# Patient Record
Sex: Female | Born: 1970
Health system: Southern US, Community
[De-identification: ages and names within clinical notes are randomized; demographics above are authoritative.]

## PROBLEM LIST (undated history)

## (undated) DIAGNOSIS — T7840XA Allergy, unspecified, initial encounter: Secondary | ICD-10-CM

## (undated) DIAGNOSIS — Z808 Family history of malignant neoplasm of other organs or systems: Secondary | ICD-10-CM

## (undated) DIAGNOSIS — J45909 Unspecified asthma, uncomplicated: Secondary | ICD-10-CM

## (undated) DIAGNOSIS — G43909 Migraine, unspecified, not intractable, without status migrainosus: Secondary | ICD-10-CM

## (undated) DIAGNOSIS — U071 COVID-19: Secondary | ICD-10-CM

## (undated) DIAGNOSIS — K219 Gastro-esophageal reflux disease without esophagitis: Secondary | ICD-10-CM

## (undated) HISTORY — DX: COVID-19: U07.1

## (undated) HISTORY — DX: Gastro-esophageal reflux disease without esophagitis: K21.9

## (undated) HISTORY — DX: Migraine, unspecified, not intractable, without status migrainosus: G43.909

## (undated) HISTORY — PX: TUBAL LIGATION: SHX77

## (undated) HISTORY — DX: Unspecified asthma, uncomplicated: J45.909

## (undated) HISTORY — DX: Allergy, unspecified, initial encounter: T78.40XA

## (undated) HISTORY — DX: Family history of malignant neoplasm of other organs or systems: Z80.8

---
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
## 1998-10-24 ENCOUNTER — Ambulatory Visit (HOSPITAL_COMMUNITY): Admission: RE | Admit: 1998-10-24 | Discharge: 1998-10-24 | Payer: Self-pay | Admitting: Obstetrics and Gynecology

## 1999-01-14 ENCOUNTER — Inpatient Hospital Stay (HOSPITAL_COMMUNITY): Admission: AD | Admit: 1999-01-14 | Discharge: 1999-01-16 | Payer: Self-pay | Admitting: Obstetrics and Gynecology

## 1999-01-19 ENCOUNTER — Encounter (HOSPITAL_COMMUNITY): Admission: RE | Admit: 1999-01-19 | Discharge: 1999-04-19 | Payer: Self-pay | Admitting: Obstetrics and Gynecology

## 1999-02-21 ENCOUNTER — Other Ambulatory Visit: Admission: RE | Admit: 1999-02-21 | Discharge: 1999-02-21 | Payer: Self-pay | Admitting: Obstetrics and Gynecology

## 2000-03-29 ENCOUNTER — Other Ambulatory Visit: Admission: RE | Admit: 2000-03-29 | Discharge: 2000-03-29 | Payer: Self-pay | Admitting: Obstetrics and Gynecology

## 2001-02-25 ENCOUNTER — Ambulatory Visit (HOSPITAL_COMMUNITY): Admission: RE | Admit: 2001-02-25 | Discharge: 2001-02-25 | Payer: Self-pay | Admitting: Obstetrics and Gynecology

## 2001-05-19 ENCOUNTER — Inpatient Hospital Stay (HOSPITAL_COMMUNITY): Admission: AD | Admit: 2001-05-19 | Discharge: 2001-05-19 | Payer: Self-pay | Admitting: Obstetrics and Gynecology

## 2001-05-24 ENCOUNTER — Inpatient Hospital Stay (HOSPITAL_COMMUNITY): Admission: AD | Admit: 2001-05-24 | Discharge: 2001-05-27 | Payer: Self-pay | Admitting: Obstetrics & Gynecology

## 2001-06-26 ENCOUNTER — Other Ambulatory Visit: Admission: RE | Admit: 2001-06-26 | Discharge: 2001-06-26 | Payer: Self-pay | Admitting: Obstetrics and Gynecology

## 2002-09-18 ENCOUNTER — Other Ambulatory Visit: Admission: RE | Admit: 2002-09-18 | Discharge: 2002-09-18 | Payer: Self-pay | Admitting: Obstetrics and Gynecology

## 2003-11-05 ENCOUNTER — Other Ambulatory Visit: Admission: RE | Admit: 2003-11-05 | Discharge: 2003-11-05 | Payer: Self-pay | Admitting: Obstetrics and Gynecology

## 2004-11-17 ENCOUNTER — Other Ambulatory Visit: Admission: RE | Admit: 2004-11-17 | Discharge: 2004-11-17 | Payer: Self-pay | Admitting: Obstetrics and Gynecology

## 2005-11-01 ENCOUNTER — Other Ambulatory Visit: Admission: RE | Admit: 2005-11-01 | Discharge: 2005-11-01 | Payer: Self-pay | Admitting: Obstetrics and Gynecology

## 2006-02-26 ENCOUNTER — Ambulatory Visit (HOSPITAL_COMMUNITY): Admission: RE | Admit: 2006-02-26 | Discharge: 2006-02-26 | Payer: Self-pay | Admitting: Obstetrics and Gynecology

## 2006-05-28 ENCOUNTER — Inpatient Hospital Stay (HOSPITAL_COMMUNITY): Admission: AD | Admit: 2006-05-28 | Discharge: 2006-06-01 | Payer: Self-pay | Admitting: Obstetrics and Gynecology

## 2006-05-29 ENCOUNTER — Encounter (INDEPENDENT_AMBULATORY_CARE_PROVIDER_SITE_OTHER): Payer: Self-pay | Admitting: Specialist

## 2008-10-22 ENCOUNTER — Encounter: Admission: RE | Admit: 2008-10-22 | Discharge: 2008-10-22 | Payer: Self-pay | Admitting: Obstetrics and Gynecology

## 2009-06-17 ENCOUNTER — Ambulatory Visit: Payer: Self-pay | Admitting: Urology

## 2010-01-24 IMAGING — MG MM DIAGNOSTIC UNILATERAL R
1 series · 1 of 1 positions shown · non-contrast
Comparison: 10/15/2008

CLINICAL DATA: Abnormal screening mammogram with possible right
breast mass.

[REDACTED] RIGHT MAMMOGRAM

[R CC]
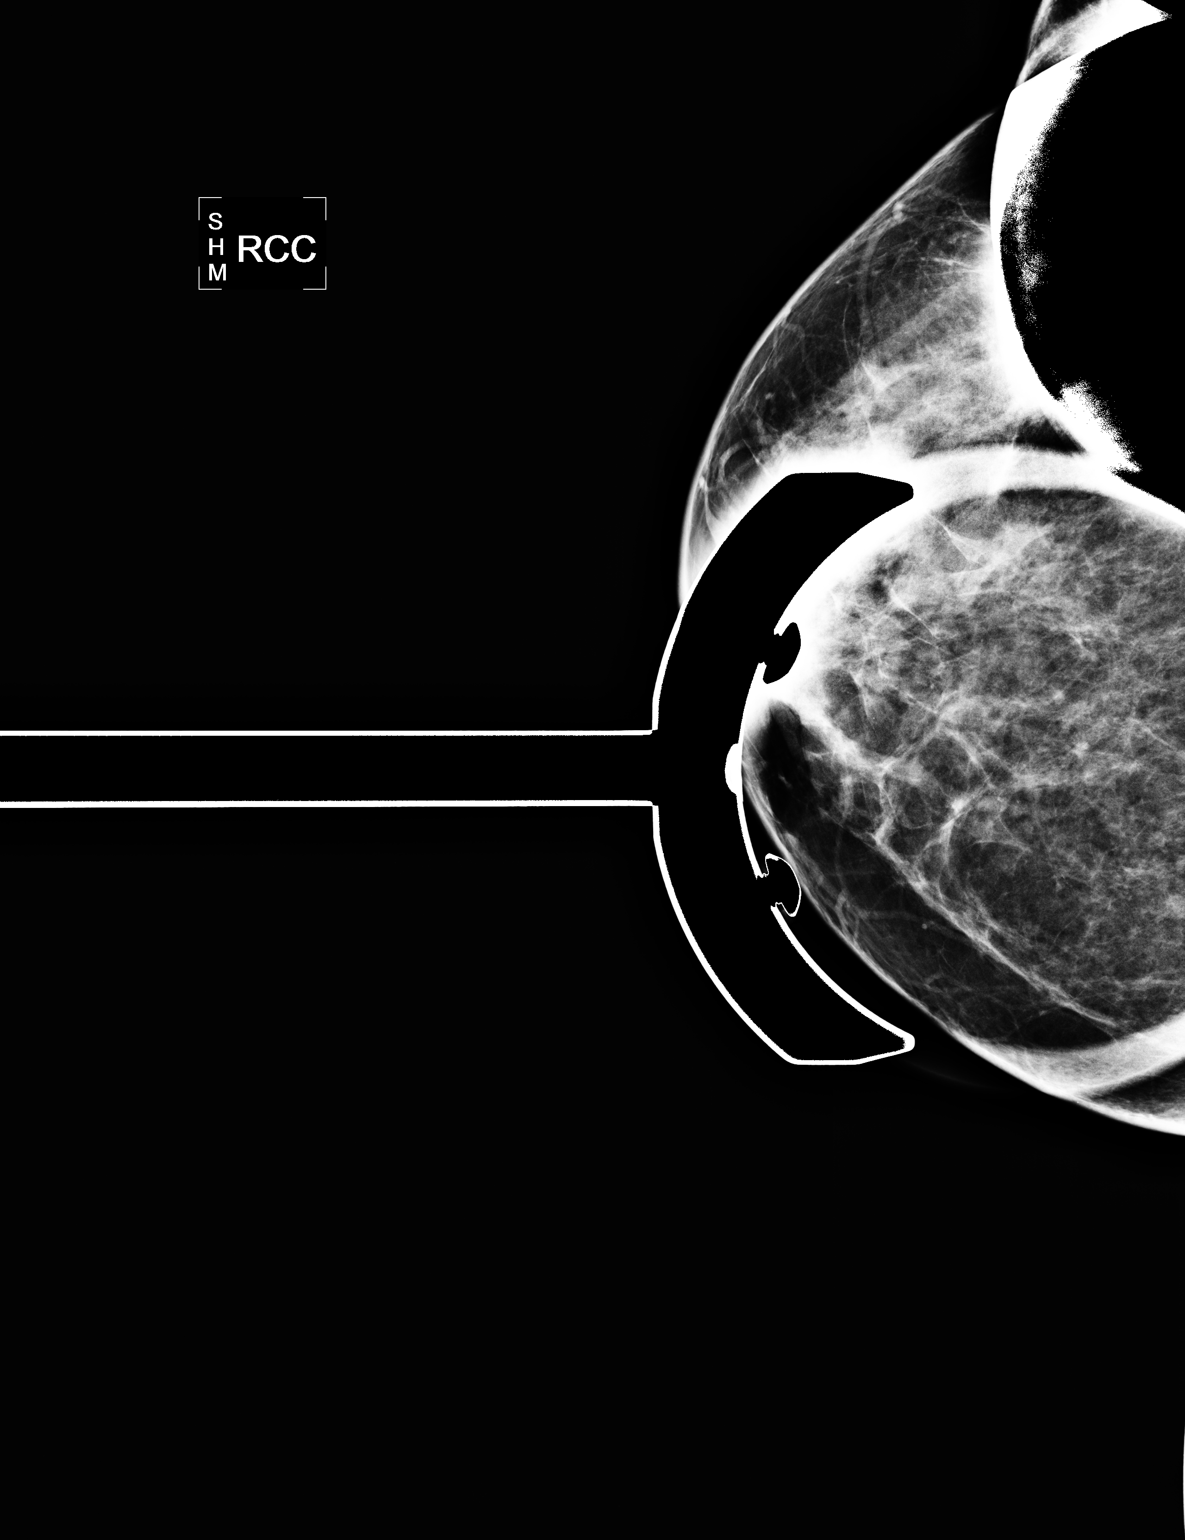

[1 of 1 positions shown; findings below may reference images not displayed]

FINDINGS: CC spot compression and ML views of the right breast are
performed in the area of screening mammogram abnormality. There is
no evidence of mass, distortion, or suspicious calcifications.
IMPRESSION: No specific mammographic evidence of malignancy or abnormality,
specifically in the area of screening mammogram abnormality.

These findings were discussed with the patient.  She was encouraged
to continue monthly self exams and to contact the [REDACTED] or
primary physician if any changes noted.

.

BI-RADS CATEGORY 1:  Negative.

Recommend bilateral screening mammogram in 1 year.

REF:B1 DICTATED: 10/22/2008 [DATE]

## 2010-07-13 ENCOUNTER — Emergency Department: Payer: Self-pay | Admitting: Emergency Medicine

## 2010-09-19 IMAGING — US US RENAL KIDNEY
1 series · 17 of 25 positions shown · non-contrast
Comparison: none

REASON FOR EXAM: hematuria   UTI
COMMENTS:

[Series 1: us renal kidney · 17 of 29 slices shown]
[im 1/29]
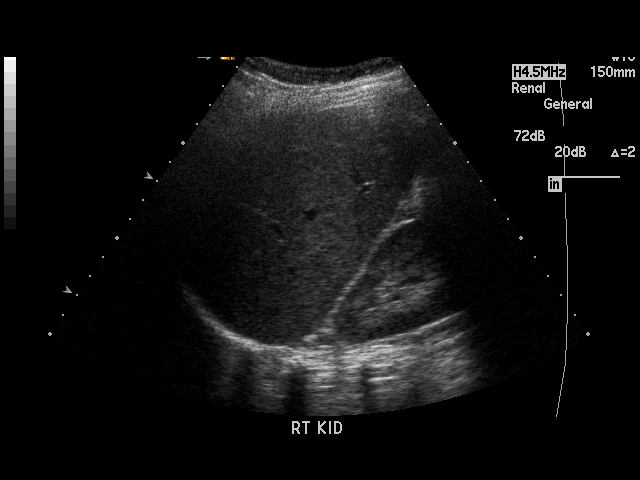
[im 3/29]
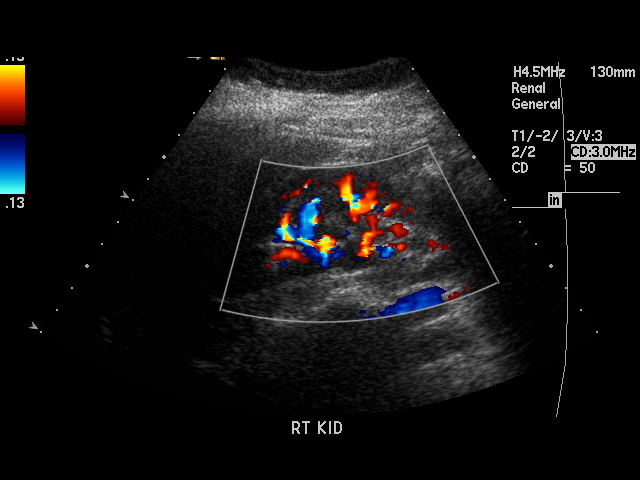
[im 4/29]
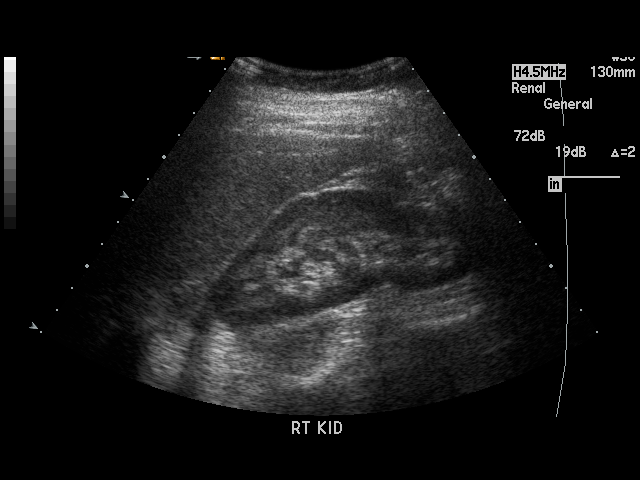
[im 6/29]
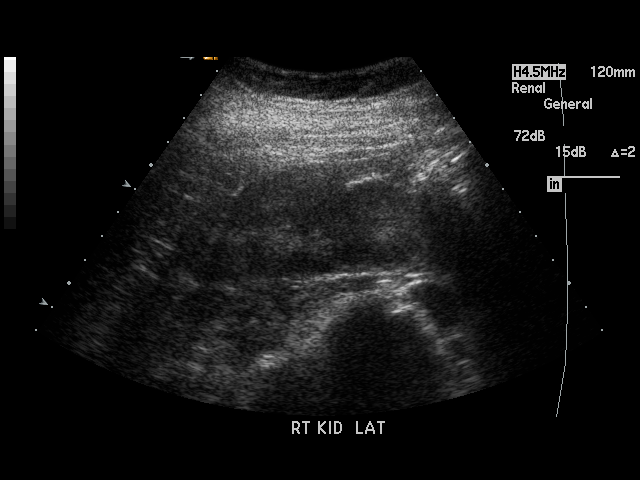
[im 8/29]
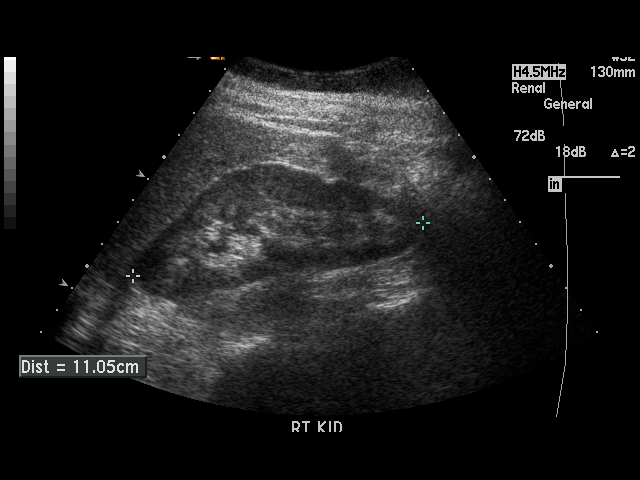
[im 10/29]
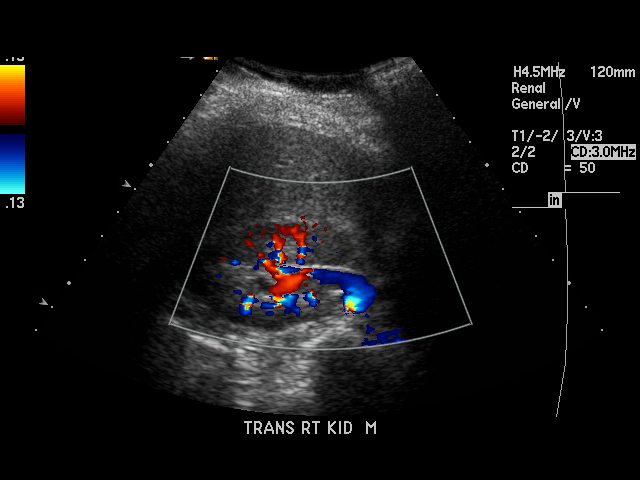
[im 11/29]
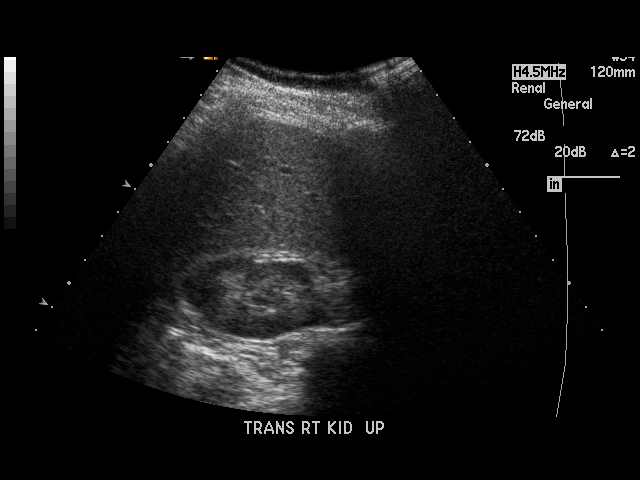
[im 13/29]
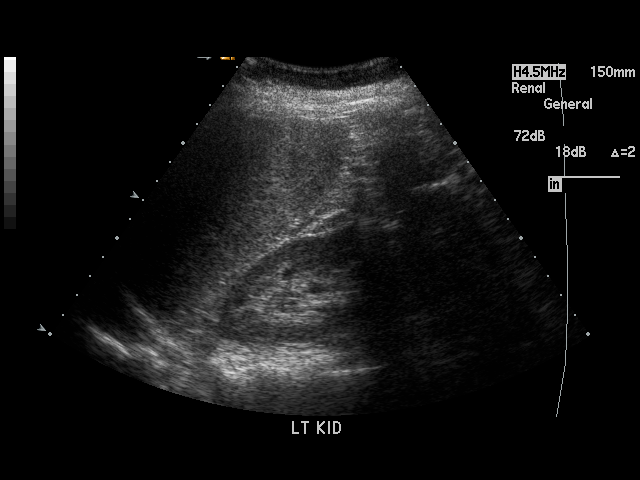
[im 15/29]
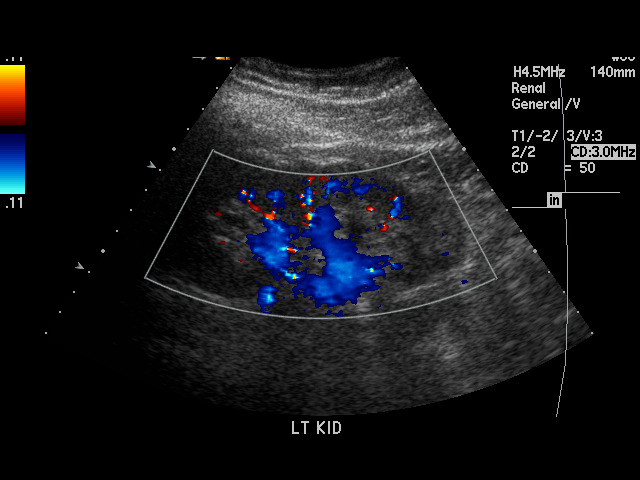
[im 16/29]
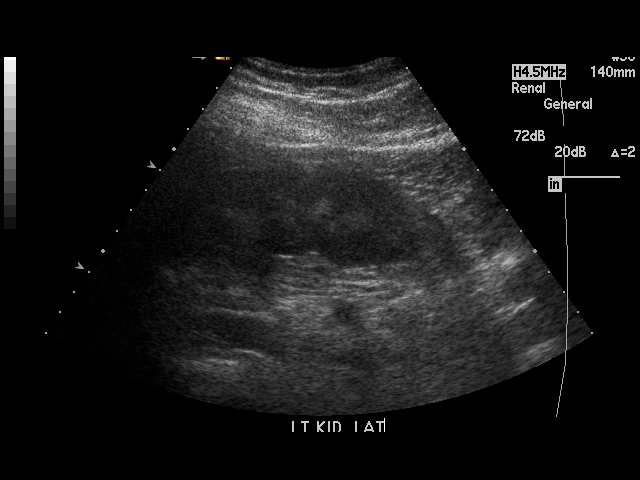
[im 18/29]
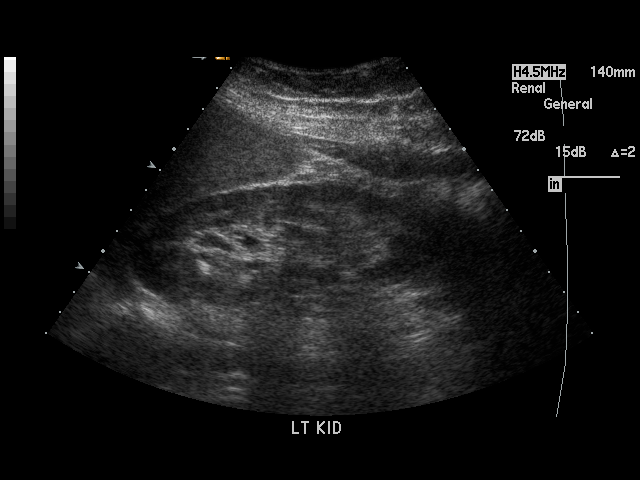
[im 19/29]
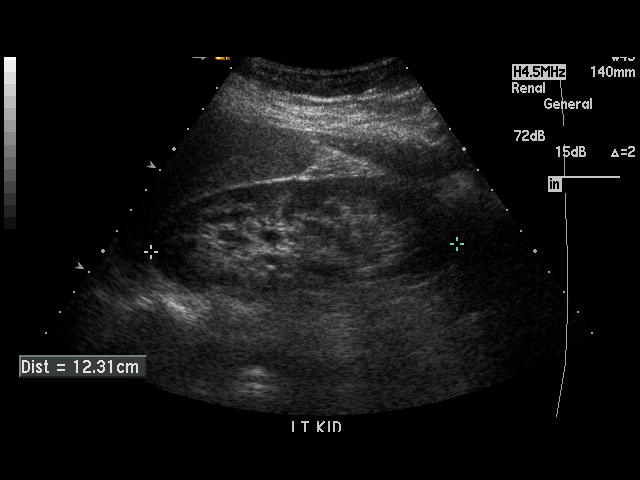
[im 22/29]
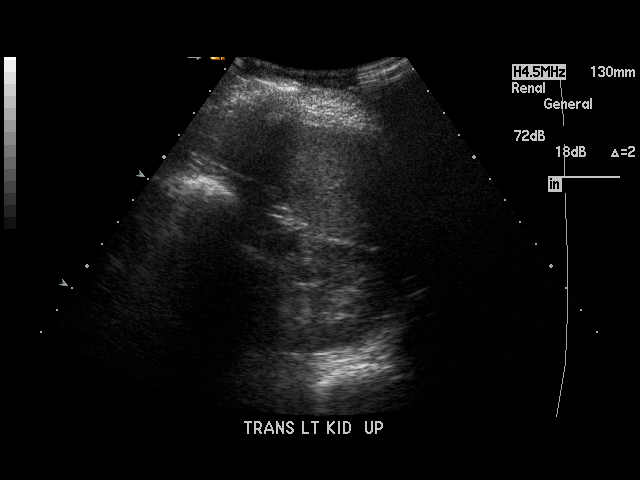
[im 23/29]
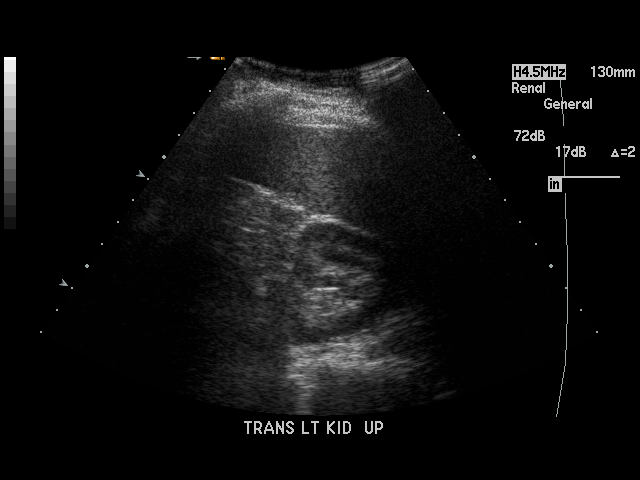
[im 25/29]
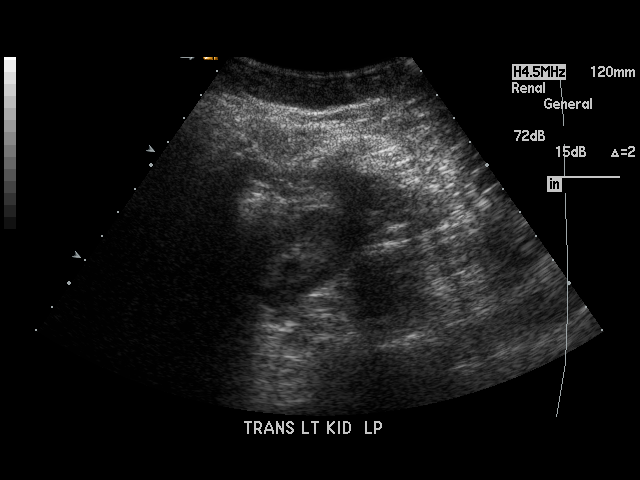
[im 26/29]
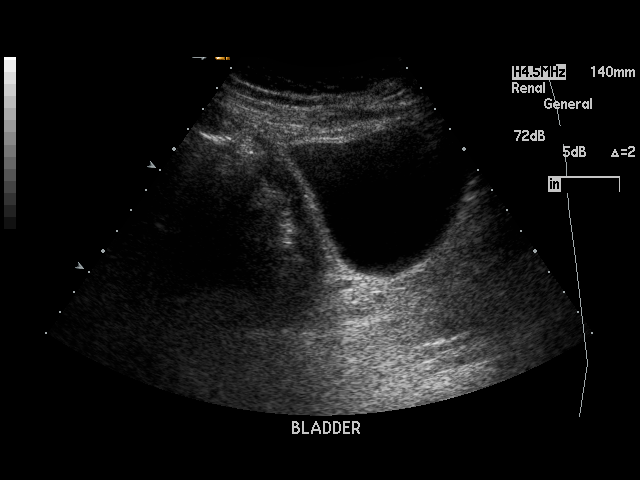
[im 29/29]
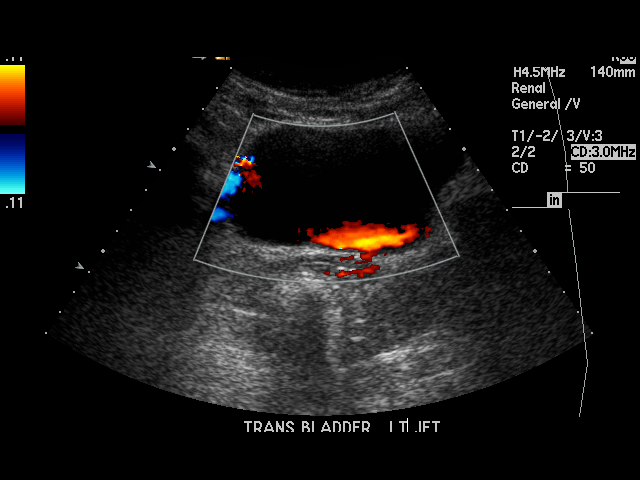

[17 of 25 positions shown; findings below may reference images not displayed]

PROCEDURE:     US  - US KIDNEY  - June 17, 2009 [DATE]

RESULT:     The right kidney measures 11.05 x 5.02 x 4.62 cm. The left
kidney measures 12.31 x 4.65 x 4.3 cm. There is appropriate corticomedullary
differentiation without evidence of hydronephrosis, masses or calculus. The
urinary bladder is distended with urine. Bilateral ureteral jets are
identified.
IMPRESSION: Unremarkable Bilateral Renal Ultrasound.

## 2010-10-08 ENCOUNTER — Encounter: Payer: Self-pay | Admitting: Obstetrics and Gynecology

## 2011-02-02 NOTE — H&P (Signed)
NAMEANGELLYNN, Kerry             ACCOUNT NO.:  [id number]   MEDICAL RECORD NO.:  [id number]          PATIENT TYPE:  INP   LOCATION:  9169                          FACILITY:  [facility]   PHYSICIAN:  Duke Salvia. Marcelle Overlie, M.D.DATE OF BIRTH:  Aug 29, 1971   DATE OF ADMISSION:  05/28/2006  DATE OF DISCHARGE:                                HISTORY & PHYSICAL   CHIEF COMPLAINT:  Decreased amniotic fluid at term.   HISTORY OF PRESENT ILLNESS:  A 40 year old G55, P2-0-0-2, EDD May 28, 2006.  Ultrasound earlier today showed BPP 6/8, EFW 6 pounds, 12 ounces,  with a low AFI less than a third percentile.  She was brought in for Cytotec  labor induction.  Prenatal course has been unremarkable, except for a  history of positive group B strep.  She is Rh negative.  One hour GTT was  73.   PAST MEDICAL HISTORY:   ALLERGIES:  1. XYLOCAINE.  2. ERYTHROMYCIN.   OBSTETRICAL HISTORY:  Has had a vaginal delivery in 2000, another in 2002,  both at term without complications.  History of group B strep is noted.   FAMILY HISTORY:  Please see her [facility] form.   PHYSICAL EXAMINATION:  VITAL SIGNS:  Temperature 98.2, blood pressure  100/60.  HEENT:  Unremarkable.  NECK:  Supple without masses.  LUNGS:  Clear.  CARDIOVASCULAR:  Regular rate and rhythm without murmurs, rubs, or gallops  noted.  BREASTS:  Not examined.  ABDOMEN:  Term fundal height.  Fetal heart rate 140.  PELVIC:  Cervix was 1-2, 50%, vertex, -1.  Membranes intact.  EXTREMITIES:  Neurologic exam unremarkable.   IMPRESSION:  Term intrauterine pregnancy, decreased amniotic fluid index.   PLAN:  Two-stage labor induction.      Richard M. Marcelle Overlie, M.D.  Electronically Signed    RMH/MEDQ  D:  05/28/2006  T:  05/28/2006  Job:  [id number]

## 2011-02-02 NOTE — Op Note (Signed)
Kerry Garrett, Kerry Garrett             ACCOUNT NO.:  [id number]   MEDICAL RECORD NO.:  [id number]          PATIENT TYPE:  INP   LOCATION:  9109                          FACILITY:  [facility]   PHYSICIAN:  Dineen Kid. Rana Snare, M.D.    DATE OF BIRTH:  September 06, 1971   DATE OF PROCEDURE:  05/29/2006  DATE OF DISCHARGE:                                 OPERATIVE REPORT   PREOPERATIVE DIAGNOSES:  1. Intrauterine pregnancy at 40 weeks.  2. Oligohydramnios.  3. Fetal intolerance to labor.  4. Multiparity but desires sterility.   POSTOPERATIVE DIAGNOSES:  1. Intrauterine pregnancy at 40 weeks.  2. Oligohydramnios.  3. Fetal intolerance to labor.  4. Multiparity but desires sterility.   PROCEDURE:  Primary low segment transverse cesarean section with [place]  bilateral tubal ligation.   SURGEON:  Dineen Kid. Rana Snare, M.D.   ANESTHESIA:  Spinal.   INDICATIONS:  [name] is a 40 year old G3, P2 seen in the office  yesterday for routine care.  Underwent ultrasound showing oligohydramnios  with an AFI 3rd percentile.  She is sent over for induction of labor.  Underwent amniotomy this morning but minimal fluids retrieved.  She  underwent augmentation of labor, again, having moderate variables.  Amnioinfusion was performed with minimal success.  Because of the continued  regular deep variables with marked variability of the fetal heart rate in  between contractions.  Remote from delivery at 2 cm.  The nurses were  uncomfortable with increasing the Pitocin.  We elected to proceed with low  segment transverse cesarean section for fetal tolerance to labor.  She also  desired sterility.  The risks and benefits were discussed at length, which  included but not limited to risk of infection, bleeding, damage to the  bowel, bladder, uterus, ovaries, fetus.  Tubal failure quoted at 5 out of  1000.  She gives her informed consent and wishes to proceed.   FINDINGS AT THE TIME OF SURGERY:  Viable female infant.  Apgars  were 8 and  9.  The pH arterial 7.32.  Weight 7 pounds, 0 ounces.  Placenta sent to  pathology.  Had battledore insertion of the cord.   DESCRIPTION OF PROCEDURE:  After adequate analgesia, the patient was placed  in the supine position with a left lateral tilt.  She is sterilely prepped  and draped.  The bladder is sterilely drained with a Foley catheter.  A  Pfannenstiel skin incision was made two fingerbreadths above the pubic  symphysis, taken down sharply to the fascia.  Incised transversely and  extended superiorly and inferiorly off the belly.  The recti were separated  sharply in the midline.  The peritoneum was entered sharply.  A bladder flap  created and placed behind the bladder blade.  A low segment myotomy incision  was made down to the infant's vertex and descended laterally with the  operator's fingertips.  The infant's vertex was delivered atraumatically.  The nose and pharynx suctioned.  The infant was delivered, the cord clamped  and cut, and handed to the pediatricians.  The cord blood was obtained.  The  placenta extracted  manually.  The uterus was exteriorized and wiped clean  with a dry lap.  The myotomy incision was closed with two layers, first  being a running locking and the second being an imbricating __________  Monocryl suture.  Each fallopian tube was identified by the fimbriated tube.  The portion of the tube was grasped.  Two sutures of 0 plain were passed  through the mesosalpinx.  The central portion of the tube excised.  Each  tube was then ligated with a 2-0 silk on the proximal portion and the tubal  ostia were made hemostatic with Bovie electrocautery.  Bilateral fallopian  tubes were hemostatic.  The uterus was placed back into the peritoneal  cavity and after a copious amount of irrigation, adequate hemostasis was  assured.  The peritoneum was closed with 0 Monocryl.  Rectus muscle plicated  in the midline with a 0 Monocryl suture.  The fascia was  then closed with a  #1 Vicryl in a running fashion.  Irrigation was applied, and after adequate  hemostasis, skin staples with Steri-Strips applied.  The patient tolerated  the procedure well, was stable on transfer to the recovery room.  Sponge and  instrument counts were normal x3.  Estimated blood loss 600 cc.  Patient  received 1 gm of Rocephin after the delivery of the placenta.      Dineen Kid Rana Snare, M.D.  Electronically Signed     DCL/MEDQ  D:  05/29/2006  T:  05/30/2006  Job:  [id number]

## 2011-02-02 NOTE — Discharge Summary (Signed)
NAMEATALIE, OROS             ACCOUNT NO.:  [id number]   MEDICAL RECORD NO.:  [id number]          PATIENT TYPE:  INP   LOCATION:  9109                          [facility]ILITY[facility]   PHYSICIAN:  Guy Sandifer. Henderson Cloud, M.D. DATE OF BIRTH:  03/25/1971   DATE OF ADMISSION:  05/28/2006  DATE OF DISCHARGE:  06/01/2006                                 DISCHARGE SUMMARY   ADMITTING DIAGNOSES:  1. Intrauterine pregnancy at 40 weeks estimated gestational age.  2. Induction of labor secondary to oligohydramnios.   DISCHARGE DIAGNOSIS:  1. Status post low transverse cesarean section secondary to fetal      intolerance to labor.  2. Viable female infant.  3. Permanent sterilization.   PROCEDURE:  1. Primary low transverse cesarean section.  2. [place] bilateral tubal ligation.   REASON FOR ADMISSION:  Please see dictated H&P.   HOSPITAL COURSE:  The patient is 40 year old gravida 3, para 2 who was  admitted to [facility] for two-stage induction of labor.  The patient had been seen in the office on the day prior to admission and  had had an ultrasound which had revealed some oligohydramnios with amniotic  fluid index in the 3rd percentile.  On the following morning after Cytotec  induction, the patient did undergo amniotomy.  However, minimal fluid was  retrieved.  The patient did undergo augmentation of her labor with Pitocin.  However, this baby did experience moderate variable decelerations.  Amnioinfusion was performed with Lasix success.  Due to fetal intolerance of  labor in cervix which was dilated to 2 cm, decision was made to proceed with  a primary low transverse cesarean section.  The patient was then transferred  to the operating room where spinal anesthesia was administered without  difficulty.  A low transverse incision was made with the delivery of a  viable female infant weighing 7 pounds 0 ounces with Apgars of 8 at 1 minute  and 9 at 5 minutes.  Arterial cord pH  was 7.32.  A battledore insertion of  the cord was also noted to the time of delivery.  The patient also underwent  bilateral tubal ligation.  She tolerated procedure well was taken to the  recovery room in stable condition.  On postoperative day #1 the patient was  without complaint.  Vital signs were stable.  She was afebrile.  Abdomen  soft.  Fundus firm and nontender.  Abdominal dressing had been reinforced  which was partially saturated with old blood.  No active bleeding was noted.  Laboratory findings revealed hemoglobin of 9.4, platelet count 169,000, WBC  count 12.2.  On postoperative day #2, the patient was without complaint.  Vital signs stable.  She was afebrile.  Fundus firm and nontender.  Incision  was noted to have a small amount of oozing noted from the right margin of  the incision.  Staples from the area had been previously removed and Steri-  Strips had been applied.  Sole Steri-Strips were removed and replaced, and  two loose staples from the left side of the incision removed.  Steri-Strips  were also applied  to that area.  On the following morning the patient was  without complaint.  Vital signs were stable.  Incision was now noted to be  clean, dry and intact without further oozing.  Discharge instructions  reviewed and the patient was later discharged home.   CONDITION ON DISCHARGE:  Stable.   DISCHARGE INSTRUCTIONS:  1. Diet regular as tolerated.  2. Activity:  No heavy lifting, no driving x2 weeks, no vaginal entry.   FOLLOW UP:  Patient follow up in the office in one week for an incision check.  She is  to call for temperature greater than 100 degrees, persistent nausea,  vomiting, heavy vaginal bleeding and/or redness or drainage from the  incisional site.   DISCHARGE MEDICATIONS:  1. Percocet 5/325, #30, one p.o. q.4-6 hours p.r.n.  2. Motrin 600 mg every 6 hours.  3. Prenatal vitamins one p.o. daily.  4. Colace p.o. daily.      Julio Sicks,  N.P.      Guy Sandifer. Henderson Cloud, M.D.  Electronically Signed    CC/MEDQ  D:  07/03/2006  T:  07/04/2006  Job:  [id number]

## 2014-04-27 ENCOUNTER — Other Ambulatory Visit: Payer: Self-pay | Admitting: Obstetrics and Gynecology

## 2014-04-28 LAB — CYTOLOGY - PAP

## 2015-05-31 ENCOUNTER — Other Ambulatory Visit: Payer: Self-pay | Admitting: Obstetrics and Gynecology

## 2015-06-01 LAB — CYTOLOGY - PAP

## 2019-03-14 DIAGNOSIS — R05 Cough: Secondary | ICD-10-CM | POA: Diagnosis not present

## 2019-03-14 DIAGNOSIS — R51 Headache: Secondary | ICD-10-CM | POA: Diagnosis not present

## 2019-03-14 DIAGNOSIS — Z20828 Contact with and (suspected) exposure to other viral communicable diseases: Secondary | ICD-10-CM | POA: Diagnosis not present

## 2019-03-14 DIAGNOSIS — R0789 Other chest pain: Secondary | ICD-10-CM | POA: Diagnosis not present

## 2019-03-22 DIAGNOSIS — R05 Cough: Secondary | ICD-10-CM | POA: Diagnosis not present

## 2019-03-22 DIAGNOSIS — U071 COVID-19: Secondary | ICD-10-CM | POA: Diagnosis not present

## 2019-04-07 DIAGNOSIS — Z03818 Encounter for observation for suspected exposure to other biological agents ruled out: Secondary | ICD-10-CM | POA: Diagnosis not present

## 2019-06-01 DIAGNOSIS — Z03818 Encounter for observation for suspected exposure to other biological agents ruled out: Secondary | ICD-10-CM | POA: Diagnosis not present

## 2019-07-09 DIAGNOSIS — D2261 Melanocytic nevi of right upper limb, including shoulder: Secondary | ICD-10-CM | POA: Diagnosis not present

## 2019-07-09 DIAGNOSIS — L710 Perioral dermatitis: Secondary | ICD-10-CM | POA: Diagnosis not present

## 2019-07-09 DIAGNOSIS — D2262 Melanocytic nevi of left upper limb, including shoulder: Secondary | ICD-10-CM | POA: Diagnosis not present

## 2019-07-09 DIAGNOSIS — D225 Melanocytic nevi of trunk: Secondary | ICD-10-CM | POA: Diagnosis not present

## 2019-07-16 DIAGNOSIS — Z13220 Encounter for screening for lipoid disorders: Secondary | ICD-10-CM | POA: Diagnosis not present

## 2019-07-16 DIAGNOSIS — Z13228 Encounter for screening for other metabolic disorders: Secondary | ICD-10-CM | POA: Diagnosis not present

## 2019-07-16 DIAGNOSIS — K649 Unspecified hemorrhoids: Secondary | ICD-10-CM | POA: Diagnosis not present

## 2019-07-16 DIAGNOSIS — Z01419 Encounter for gynecological examination (general) (routine) without abnormal findings: Secondary | ICD-10-CM | POA: Diagnosis not present

## 2019-07-16 DIAGNOSIS — Z1231 Encounter for screening mammogram for malignant neoplasm of breast: Secondary | ICD-10-CM | POA: Diagnosis not present

## 2019-07-16 DIAGNOSIS — Z1321 Encounter for screening for nutritional disorder: Secondary | ICD-10-CM | POA: Diagnosis not present

## 2019-07-16 DIAGNOSIS — R6882 Decreased libido: Secondary | ICD-10-CM | POA: Diagnosis not present

## 2019-07-16 DIAGNOSIS — Z6824 Body mass index (BMI) 24.0-24.9, adult: Secondary | ICD-10-CM | POA: Diagnosis not present

## 2019-07-16 DIAGNOSIS — Z1329 Encounter for screening for other suspected endocrine disorder: Secondary | ICD-10-CM | POA: Diagnosis not present

## 2019-07-23 DIAGNOSIS — Z03818 Encounter for observation for suspected exposure to other biological agents ruled out: Secondary | ICD-10-CM | POA: Diagnosis not present

## 2019-07-24 ENCOUNTER — Other Ambulatory Visit: Payer: Self-pay | Admitting: Obstetrics and Gynecology

## 2019-07-24 DIAGNOSIS — Z803 Family history of malignant neoplasm of breast: Secondary | ICD-10-CM

## 2019-09-22 DIAGNOSIS — Z03818 Encounter for observation for suspected exposure to other biological agents ruled out: Secondary | ICD-10-CM | POA: Diagnosis not present

## 2019-10-12 DIAGNOSIS — H524 Presbyopia: Secondary | ICD-10-CM | POA: Diagnosis not present

## 2019-10-12 DIAGNOSIS — H00012 Hordeolum externum right lower eyelid: Secondary | ICD-10-CM | POA: Diagnosis not present

## 2019-10-12 DIAGNOSIS — H5203 Hypermetropia, bilateral: Secondary | ICD-10-CM | POA: Diagnosis not present

## 2019-10-12 DIAGNOSIS — H52223 Regular astigmatism, bilateral: Secondary | ICD-10-CM | POA: Diagnosis not present

## 2019-11-18 DIAGNOSIS — Z03818 Encounter for observation for suspected exposure to other biological agents ruled out: Secondary | ICD-10-CM | POA: Diagnosis not present

## 2019-12-28 DIAGNOSIS — Z20828 Contact with and (suspected) exposure to other viral communicable diseases: Secondary | ICD-10-CM | POA: Diagnosis not present

## 2019-12-28 DIAGNOSIS — Z03818 Encounter for observation for suspected exposure to other biological agents ruled out: Secondary | ICD-10-CM | POA: Diagnosis not present

## 2020-01-29 DIAGNOSIS — Z20828 Contact with and (suspected) exposure to other viral communicable diseases: Secondary | ICD-10-CM | POA: Diagnosis not present

## 2020-03-10 DIAGNOSIS — Z20828 Contact with and (suspected) exposure to other viral communicable diseases: Secondary | ICD-10-CM | POA: Diagnosis not present

## 2020-04-21 DIAGNOSIS — Z20828 Contact with and (suspected) exposure to other viral communicable diseases: Secondary | ICD-10-CM | POA: Diagnosis not present

## 2020-05-18 DIAGNOSIS — Z1152 Encounter for screening for COVID-19: Secondary | ICD-10-CM | POA: Diagnosis not present

## 2020-05-18 DIAGNOSIS — Z03818 Encounter for observation for suspected exposure to other biological agents ruled out: Secondary | ICD-10-CM | POA: Diagnosis not present

## 2020-06-07 DIAGNOSIS — Z20828 Contact with and (suspected) exposure to other viral communicable diseases: Secondary | ICD-10-CM | POA: Diagnosis not present

## 2020-06-08 ENCOUNTER — Other Ambulatory Visit: Payer: Self-pay

## 2020-06-10 ENCOUNTER — Encounter: Payer: Self-pay | Admitting: Nurse Practitioner

## 2020-06-10 ENCOUNTER — Ambulatory Visit: Payer: BC Managed Care – PPO | Admitting: Nurse Practitioner

## 2020-06-10 ENCOUNTER — Other Ambulatory Visit: Payer: Self-pay

## 2020-06-10 VITALS — BP 104/62 | HR 74 | Temp 98.2°F | Ht 61.5 in | Wt 137.0 lb

## 2020-06-10 DIAGNOSIS — Z808 Family history of malignant neoplasm of other organs or systems: Secondary | ICD-10-CM

## 2020-06-10 DIAGNOSIS — Z0000 Encounter for general adult medical examination without abnormal findings: Secondary | ICD-10-CM | POA: Diagnosis not present

## 2020-06-10 DIAGNOSIS — Z0001 Encounter for general adult medical examination with abnormal findings: Secondary | ICD-10-CM | POA: Insufficient documentation

## 2020-06-10 DIAGNOSIS — K594 Anal spasm: Secondary | ICD-10-CM

## 2020-06-10 DIAGNOSIS — J309 Allergic rhinitis, unspecified: Secondary | ICD-10-CM | POA: Diagnosis not present

## 2020-06-10 DIAGNOSIS — Z2839 Other underimmunization status: Secondary | ICD-10-CM | POA: Insufficient documentation

## 2020-06-10 DIAGNOSIS — Z283 Underimmunization status: Secondary | ICD-10-CM

## 2020-06-10 DIAGNOSIS — Z8616 Personal history of COVID-19: Secondary | ICD-10-CM | POA: Insufficient documentation

## 2020-06-10 NOTE — Assessment & Plan Note (Signed)
Her allergy symptoms are well controlled with as needed Claritin and Flonase.

## 2020-06-10 NOTE — Addendum Note (Signed)
Addended by: Warden Fillers on: 06/10/2020 11:44 AM   Modules accepted: Orders

## 2020-06-10 NOTE — Progress Notes (Signed)
Established Patient Office Visit  Subjective:  Patient ID: Kerry Garrett, female    DOB: [birth year]  Age: 49 y.o. MRN: [id number]  CC:  Chief Complaint  Patient presents with  . New Patient (Initial Visit)    establish care    HPI Kerry Garrett is a healthy 49 year old who presents to establish care with primary care provider.  She has no specific concerns today.    Family history of melanoma: Patient sees dermatology annually.  Both parents have had melanoma.  Kerry Garrett has never had any abnormal skin moles or biopsies.  She does get rectal spasm a couple times a year usually at night treated with glycerin suppository.  She has no problems with IBS during the daytime.  Normal bowel habits.  No blood or melena.  Family history negative for colon cancer or colon polyp.  She did have Covid 19 virus positive for 03/13/2020: Reports she had a mild case no sequelae.  She does get her antibodies tested periodically and had it recently at 600.  She declines Covid vaccine.  Patient presents today for complete physical.  Immunizations:Unkown, no Covid vaccine. She had Coivd end March 14 2019 at [facility]- She has AB 630 this week and checking every 6 weeks. recommend tetanus, flu shot  Diet: regualr Exercise: Beach body at home- 45 min 6 days a week  Colonoscopy: not yet  Pap Smear: GYN Nov 2020.Marland Kitchen BC pills Mammogram: due Nov- WNL  Dentist: UTD Vision: UTD   Past Medical History:  Diagnosis Date  . Allergy    Claritin controls and Flonase   . Asthma    as a CHILD- NO PROBELM AS ADULT  . FH: melanoma    gets annual exams- Dr.Isenstein  . GERD (gastroesophageal reflux disease)    Diet controlled  . Migraines      Past Surgical History:  Procedure Laterality Date  . CESAREAN SECTION    . TONSILLECTOMY  1987  . TUBAL LIGATION      Family History  Problem Relation Age of Onset  . Cancer Mother   . Cancer Father   . Asthma Son   . Cancer Maternal Grandmother   .  Depression Maternal Grandmother   . Cancer Maternal Grandfather     Social History   Socioeconomic History  . Marital status: Married    Spouse name: Not on file  . Number of children: 3  . Years of education: Not on file  . Highest education level: Master's degree (e.g., MA, MS, MEng, MEd, MSW, MBA)  Occupational History  . Not on file  Tobacco Use  . Smoking status: Never Smoker  . Smokeless tobacco: Never Used  Vaping Use  . Vaping Use: Never used  Substance and Sexual Activity  . Alcohol use: Not Currently  . Drug use: Never  . Sexual activity: Yes    Partners: Male    Birth control/protection: Pill, Surgical  Other Topics Concern  . Not on file  Social History Narrative   Married with  3 kids- ages 21-19-14   She enjoys the beach, swims, social   Social Determinants of Health   Financial Resource Strain:   . Difficulty of Paying Living Expenses: Not on file  Food Insecurity:   . Worried About Charity fundraiser in the Last Year: Not on file  . Ran Out of Food in the Last Year: Not on file  Transportation Needs:   . Lack of Transportation (Medical): Not on file  .  Lack of Transportation (Non-Medical): Not on file  Physical Activity:   . Days of Exercise per Week: Not on file  . Minutes of Exercise per Session: Not on file  Stress:   . Feeling of Stress : Not on file  Social Connections:   . Frequency of Communication with Friends and Family: Not on file  . Frequency of Social Gatherings with Friends and Family: Not on file  . Attends Religious Services: Not on file  . Active Member of Clubs or Organizations: Not on file  . Attends Archivist Meetings: Not on file  . Marital Status: Not on file  Intimate Partner Violence:   . Fear of Current or Ex-Partner: Not on file  . Emotionally Abused: Not on file  . Physically Abused: Not on file  . Sexually Abused: Not on file    Outpatient Medications Prior to Visit  Medication Sig Dispense Refill  .  AUROVELA FE 1/20 1-20 MG-MCG tablet Take 1 tablet by mouth daily.    . fluticasone (FLONASE) 50 MCG/ACT nasal spray Place into both nostrils daily.    Marland Kitchen loratadine (CLARITIN) 10 MG tablet     . melatonin 3 MG TABS tablet Take 3 mg by mouth at bedtime.     No facility-administered medications prior to visit.    Allergies  Allergen Reactions  . Azithromycin Nausea Only  . Erythromycin Base   . Lidocaine Rash    Review of Systems  Constitutional: Negative.   HENT: Negative.        3 episode of vertigo in the last 1-2 years- wakes up with room spinning and and nausaea- walking sideways out of bed and lasts am to mid afternoon- takes Dramamine and gone by afternoon and slowly gets pa dn ee is fine. Last<12 hours.LAst happened January 14, 2020.   Eyes: Negative.   Respiratory: Negative.   Cardiovascular: Negative.   Gastrointestinal:       Gets rectal pain in middle of night - feels like a brick in the rectum- uses glycerine suppository a few times a year. It occurred last week. Once she expels stool and the pain resolves.   Endocrine: Negative.   Genitourinary: Negative.   Musculoskeletal: Negative.   Skin: Negative.   Allergic/Immunologic: Positive for environmental allergies. Negative for food allergies and immunocompromised state.  Neurological: Negative.  Negative for seizures, light-headedness and headaches.  Hematological: Negative.   Psychiatric/Behavioral: Negative.       Objective:    Physical Exam  BP 104/62 (BP Location: Left Arm, Patient Position: Sitting, Cuff Size: Normal)   Pulse 74   Temp 98.2 F (36.8 C) (Oral)   Ht 5' 1.5" (1.562 m)   Wt 137 lb (62.1 kg)   SpO2 98%   BMI 25.47 kg/m  Wt Readings from Last 3 Encounters:  06/10/20 137 lb (62.1 kg)   Pulse Readings from Last 3 Encounters:  06/10/20 74    BP Readings from Last 3 Encounters:  06/10/20 104/62    No results found for: CHOL, HDL, LDLCALC, LDLDIRECT, TRIG, CHOLHDL    Health Maintenance Due   Topic Date Due  . Hepatitis C Screening  Never done  . HIV Screening  Never done  . TETANUS/TDAP  Never done    There are no preventive care reminders to display for this patient.  No results found for: TSH No results found for: WBC, HGB, HCT, MCV, PLT No results found for: NA, K, CHLORIDE, CO2, GLUCOSE, BUN, CREATININE, BILITOT, ALKPHOS, AST, ALT,  PROT, ALBUMIN, CALCIUM, ANIONGAP, EGFR, GFR No results found for: CHOL No results found for: HDL No results found for: LDLCALC No results found for: TRIG No results found for: CHOLHDL No results found for: HGBA1C    Assessment & Plan:   Problem List Items Addressed This Visit      Respiratory   Allergic rhinitis    Her allergy symptoms are well controlled with as needed Claritin and Flonase.        Other   Well woman exam without gynecological exam - Primary   Relevant Orders   CBC with Differential/Platelet   TSH   Comprehensive metabolic panel   Hemoglobin A1c   Hepatitis C antibody   HIV Antibody (routine testing w rflx)   Lipid panel   VITAMIN D 25 Hydroxy (Vit-D Deficiency, Fractures)   B12 and Folate Panel   FH: melanoma    Both parents have a history of melanoma.  She sees dermatology annually and has never had an abnormal mole.      Rectal spasm    She will get an rectal spasms at night just a few times a year usually relieved with glycerin suppository and bowel movement.  We discussed this is likely irritable bowel spasm.  She does not have it often enough to trial hyoscyamine product at this time.  We discussed colon cancer screening and she is going to talk to her parents about which GI doctor they like to use and get back to [place].  There is no family history of colon cancer or colon polyps that she is aware.      Immunizations incomplete    Counseled regarding immunizations that need to be updated with his Tdap, Covid, and at age 62 next year she will be eligible for Shingrix.  Patient wants to check with her  OB/GYN as she thinks she had Tdap when she was pregnant with her daughter 14 years ago.  Explained to her that Tdap is every 10 years.  She still wants to look into this.   Since she had Covid infection last June, she does check her antibodies and reports that she does not want to have the vaccine.  We discussed that wee not sure if the antibodies will protect as well as the vaccine and the vaccine is still recommended.  She declines.      History of 2019 novel coronavirus disease (COVID-19)      No orders of the defined types were placed in this encounter. Please arrange fasting lab appointment at your convenience.  Please look into your last tetanus vaccine.  We do recommend a tetanus vaccine at every 10 years.  Please come back if you would like to get the tetanus vaccine or you can even get this through the pharmacy.  It is important that you have a tetanus protection every 10 years.  I do recommend the Covid vaccine.  I do recommend colon cancer screening and with colonoscopy.  Let us know which GI provider you would like to see.  Continue with your excellent healthy lifestyle exercise, good diet, dental visits every 6 months, and annual eye exam.  Follow-up with GYN for your Pap test and mammogram as you have planned.  As always, use sunscreen when outdoors, and follow-up with your dermatology "annual appointments every year.   Follow-up: Return in about 1 year (around 06/10/2021).  This visit occurred during the SARS-CoV-2 public health emergency.  Safety protocols were in place, including screening questions prior to the  visit, additional usage of staff PPE, and extensive cleaning of exam room while observing appropriate contact time as indicated for disinfecting solutions.    Denice Paradise, NP

## 2020-06-10 NOTE — Assessment & Plan Note (Signed)
Both parents have a history of melanoma.  She sees dermatology annually and has never had an abnormal mole.

## 2020-06-10 NOTE — Patient Instructions (Addendum)
Please arrange fasting lab appointment at your convenience.  Please look into your last tetanus vaccine.  We do recommend a tetanus vaccine at every 10 years.  Please come back if you would like to get the tetanus vaccine or you can even get this through the pharmacy.  It is important that you have a tetanus protection every 10 years.  I do recommend the Covid vaccine.  I do recommend colon cancer screening and with colonoscopy.  Let us know which GI provider you would like to see.  Continue with your excellent healthy lifestyle exercise, good diet, dental visits every 6 months, and annual eye exam.  Follow-up with GYN for your Pap test and mammogram as you have planned.  As always, use sunscreen when outdoors, and follow-up with your dermatology "annual appointments every year.    Preventive Care 67-75 Years Old, Female Preventive care refers to visits with your health care provider and lifestyle choices that can promote health and wellness. This includes:  A yearly physical exam. This may also be called an annual well check.  Regular dental visits and eye exams.  Immunizations.  Screening for certain conditions.  Healthy lifestyle choices, such as eating a healthy diet, getting regular exercise, not using drugs or products that contain nicotine and tobacco, and limiting alcohol use. What can I expect for my preventive care visit? Physical exam Your health care provider will check your:  Height and weight. This may be used to calculate body mass index (BMI), which tells if you are at a healthy weight.  Heart rate and blood pressure.  Skin for abnormal spots. Counseling Your health care provider may ask you questions about your:  Alcohol, tobacco, and drug use.  Emotional well-being.  Home and relationship well-being.  Sexual activity.  Eating habits.  Work and work Statistician.  Method of birth control.  Menstrual cycle.  Pregnancy history. What immunizations do  I need?  Influenza (flu) vaccine  This is recommended every year. Tetanus, diphtheria, and pertussis (Tdap) vaccine  You may need a Td booster every 10 years. Varicella (chickenpox) vaccine  You may need this if you have not been vaccinated. Zoster (shingles) vaccine  You may need this after age 53. Measles, mumps, and rubella (MMR) vaccine  You may need at least one dose of MMR if you were born in 1957 or later. You may also need a second dose. Pneumococcal conjugate (PCV13) vaccine  You may need this if you have certain conditions and were not previously vaccinated. Pneumococcal polysaccharide (PPSV23) vaccine  You may need one or two doses if you smoke cigarettes or if you have certain conditions. Meningococcal conjugate (MenACWY) vaccine  You may need this if you have certain conditions. Hepatitis A vaccine  You may need this if you have certain conditions or if you travel or work in places where you may be exposed to hepatitis A. Hepatitis B vaccine  You may need this if you have certain conditions or if you travel or work in places where you may be exposed to hepatitis B. Haemophilus influenzae type b (Hib) vaccine  You may need this if you have certain conditions. Human papillomavirus (HPV) vaccine  If recommended by your health care provider, you may need three doses over 6 months. You may receive vaccines as individual doses or as more than one vaccine together in one shot (combination vaccines). Talk with your health care provider about the risks and benefits of combination vaccines. What tests do I need? Blood  tests  Lipid and cholesterol levels. These may be checked every 5 years, or more frequently if you are over 34 years old.  Hepatitis C test.  Hepatitis B test. Screening  Lung cancer screening. You may have this screening every year starting at age 28 if you have a 30-pack-year history of smoking and currently smoke or have quit within the past 15  years.  Colorectal cancer screening. All adults should have this screening starting at age 64 and continuing until age 32. Your health care provider may recommend screening at age 15 if you are at increased risk. You will have tests every 1-10 years, depending on your results and the type of screening test.  Diabetes screening. This is done by checking your blood sugar (glucose) after you have not eaten for a while (fasting). You may have this done every 1-3 years.  Mammogram. This may be done every 1-2 years. Talk with your health care provider about when you should start having regular mammograms. This may depend on whether you have a family history of breast cancer.  BRCA-related cancer screening. This may be done if you have a family history of breast, ovarian, tubal, or peritoneal cancers.  Pelvic exam and Pap test. This may be done every 3 years starting at age 75. Starting at age 29, this may be done every 5 years if you have a Pap test in combination with an HPV test. Other tests  Sexually transmitted disease (STD) testing.  Bone density scan. This is done to screen for osteoporosis. You may have this scan if you are at high risk for osteoporosis. Follow these instructions at home: Eating and drinking  Eat a diet that includes fresh fruits and vegetables, whole grains, lean protein, and low-fat dairy.  Take vitamin and mineral supplements as recommended by your health care provider.  Do not drink alcohol if: ? Your health care provider tells you not to drink. ? You are pregnant, may be pregnant, or are planning to become pregnant.  If you drink alcohol: ? Limit how much you have to 0-1 drink a day. ? Be aware of how much alcohol is in your drink. In the [place] one drink equals one 12 oz bottle of beer (355 mL), one 5 oz glass of wine (148 mL), or one 1 oz glass of hard liquor (44 mL). Lifestyle  Take daily care of your teeth and gums.  Stay active. Exercise for at least 30  minutes on 5 or more days each week.  Do not use any products that contain nicotine or tobacco, such as cigarettes, e-cigarettes, and chewing tobacco. If you need help quitting, ask your health care provider.  If you are sexually active, practice safe sex. Use a condom or other form of birth control (contraception) in order to prevent pregnancy and STIs (sexually transmitted infections).  If told by your health care provider, take low-dose aspirin daily starting at age 54. What's next?  Visit your health care provider once a year for a well check visit.  Ask your health care provider how often you should have your eyes and teeth checked.  Stay up to date on all vaccines. This information is not intended to replace advice given to you by your health care provider. Make sure you discuss any questions you have with your health care provider. Document Revised: 05/15/2018 Document Reviewed: 05/15/2018 Elsevier Patient Education  2020 [employer].

## 2020-06-10 NOTE — Assessment & Plan Note (Signed)
Counseled regarding immunizations that need to be updated with his Tdap, Covid, and at age 49 next year she will be eligible for Shingrix.  Patient wants to check with her OB/GYN as she thinks she had Tdap when she was pregnant with her daughter 14 years ago.  Explained to her that Tdap is every 10 years.  She still wants to look into this.   Since she had Covid infection last June, she does check her antibodies and reports that she does not want to have the vaccine.  We discussed that wee not sure if the antibodies will protect as well as the vaccine and the vaccine is still recommended.  She declines.

## 2020-06-10 NOTE — Assessment & Plan Note (Signed)
She will get an rectal spasms at night just a few times a year usually relieved with glycerin suppository and bowel movement.  We discussed this is likely irritable bowel spasm.  She does not have it often enough to trial hyoscyamine product at this time.  We discussed colon cancer screening and she is going to talk to her parents about which GI doctor they like to use and get back to [place].  There is no family history of colon cancer or colon polyps that she is aware.

## 2020-06-13 ENCOUNTER — Other Ambulatory Visit (INDEPENDENT_AMBULATORY_CARE_PROVIDER_SITE_OTHER): Payer: BC Managed Care – PPO

## 2020-06-13 ENCOUNTER — Other Ambulatory Visit: Payer: Self-pay

## 2020-06-13 DIAGNOSIS — E538 Deficiency of other specified B group vitamins: Secondary | ICD-10-CM

## 2020-06-13 DIAGNOSIS — Z0000 Encounter for general adult medical examination without abnormal findings: Secondary | ICD-10-CM

## 2020-06-13 LAB — LIPID PANEL
Cholesterol: 150 mg/dL (ref 0–200)
HDL: 49.7 mg/dL (ref >39.00)
LDL Cholesterol: 89 mg/dL (ref 0–99)
NonHDL: 100.2
Total CHOL/HDL Ratio: 3
Triglycerides: 54 mg/dL (ref 0.0–149.0)
VLDL: 10.8 mg/dL (ref 0.0–40.0)

## 2020-06-13 LAB — COMPREHENSIVE METABOLIC PANEL
ALT: 10 U/L (ref 0–35)
AST: 15 U/L (ref 0–37)
Albumin: 3.8 g/dL (ref 3.5–5.2)
Alkaline Phosphatase: 36 U/L — ABNORMAL LOW (ref 39–117)
BUN: 13 mg/dL (ref 6–23)
CO2: 25 mEq/L (ref 19–32)
Calcium: 8.6 mg/dL (ref 8.4–10.5)
Chloride: 107 mEq/L (ref 96–112)
Creatinine, Ser: 0.79 mg/dL (ref 0.40–1.20)
GFR: 77.25 mL/min (ref >60.00)
Glucose, Bld: 93 mg/dL (ref 70–99)
Potassium: 4.5 mEq/L (ref 3.5–5.1)
Sodium: 138 mEq/L (ref 135–145)
Total Bilirubin: 0.6 mg/dL (ref 0.2–1.2)
Total Protein: 6.2 g/dL (ref 6.0–8.3)

## 2020-06-13 LAB — CBC WITH DIFFERENTIAL/PLATELET
Basophils Absolute: 0.1 10*3/uL (ref 0.0–0.1)
Basophils Relative: 1 % (ref 0.0–3.0)
Eosinophils Absolute: 0.1 10*3/uL (ref 0.0–0.7)
Eosinophils Relative: 1.4 % (ref 0.0–5.0)
HCT: 40.9 % (ref 36.0–46.0)
Hemoglobin: 13.8 g/dL (ref 12.0–15.0)
Lymphocytes Relative: 38.2 % (ref 12.0–46.0)
Lymphs Abs: 2.5 10*3/uL (ref 0.7–4.0)
MCHC: 33.8 g/dL (ref 30.0–36.0)
MCV: 97.3 fl (ref 78.0–100.0)
Monocytes Absolute: 0.5 10*3/uL (ref 0.1–1.0)
Monocytes Relative: 7.7 % (ref 3.0–12.0)
Neutro Abs: 3.3 10*3/uL (ref 1.4–7.7)
Neutrophils Relative %: 51.7 % (ref 43.0–77.0)
Platelets: 210 10*3/uL (ref 150.0–400.0)
RBC: 4.2 Mil/uL (ref 3.87–5.11)
RDW: 12.4 % (ref 11.5–15.5)
WBC: 6.5 10*3/uL (ref 4.0–10.5)

## 2020-06-13 LAB — TSH: TSH: 2.14 u[IU]/mL (ref 0.35–4.50)

## 2020-06-13 LAB — B12 AND FOLATE PANEL
Folate: 13.2 ng/mL (ref >5.9)
Vitamin B-12: 120 pg/mL — ABNORMAL LOW (ref 211–911)

## 2020-06-13 LAB — VITAMIN D 25 HYDROXY (VIT D DEFICIENCY, FRACTURES): VITD: 31.05 ng/mL (ref 30.00–100.00)

## 2020-06-13 LAB — HEMOGLOBIN A1C: Hgb A1c MFr Bld: 5.3 % (ref 4.6–6.5)

## 2020-06-14 LAB — HEPATITIS C ANTIBODY
Hepatitis C Ab: NONREACTIVE
SIGNAL TO CUT-OFF: 0.03 (ref <1.00)

## 2020-06-14 LAB — HIV ANTIBODY (ROUTINE TESTING W REFLEX): HIV 1&2 Ab, 4th Generation: NONREACTIVE

## 2020-06-15 NOTE — Addendum Note (Signed)
Addended by: Amedeo Kinsman A on: 06/15/2020 08:32 AM   Modules accepted: Orders

## 2020-07-08 DIAGNOSIS — D2261 Melanocytic nevi of right upper limb, including shoulder: Secondary | ICD-10-CM | POA: Diagnosis not present

## 2020-07-08 DIAGNOSIS — D225 Melanocytic nevi of trunk: Secondary | ICD-10-CM | POA: Diagnosis not present

## 2020-07-08 DIAGNOSIS — D2262 Melanocytic nevi of left upper limb, including shoulder: Secondary | ICD-10-CM | POA: Diagnosis not present

## 2020-07-08 DIAGNOSIS — L710 Perioral dermatitis: Secondary | ICD-10-CM | POA: Diagnosis not present

## 2020-07-14 ENCOUNTER — Other Ambulatory Visit: Payer: Self-pay

## 2020-07-14 ENCOUNTER — Other Ambulatory Visit (INDEPENDENT_AMBULATORY_CARE_PROVIDER_SITE_OTHER): Payer: BC Managed Care – PPO

## 2020-07-14 DIAGNOSIS — E538 Deficiency of other specified B group vitamins: Secondary | ICD-10-CM

## 2020-07-14 LAB — B12 AND FOLATE PANEL
Folate: 15.1 ng/mL (ref >5.9)
Vitamin B-12: 362 pg/mL (ref 211–911)

## 2020-07-16 LAB — CELIAC DISEASE AB SCREEN W/RFX
Antigliadin Abs, IgA: 3 units (ref 0–19)
IgA/Immunoglobulin A, Serum: 200 mg/dL (ref 87–352)
Transglutaminase IgA: <2 U/mL (ref 0–3)

## 2020-07-18 LAB — METHYLMALONIC ACID, SERUM: Methylmalonic Acid, Quant: 113 nmol/L (ref 87–318)

## 2020-07-18 LAB — INTRINSIC FACTOR ANTIBODIES: Intrinsic Factor: NEGATIVE

## 2020-08-01 DIAGNOSIS — Z20828 Contact with and (suspected) exposure to other viral communicable diseases: Secondary | ICD-10-CM | POA: Diagnosis not present

## 2020-08-09 DIAGNOSIS — Z1231 Encounter for screening mammogram for malignant neoplasm of breast: Secondary | ICD-10-CM | POA: Diagnosis not present

## 2020-08-09 DIAGNOSIS — Z6825 Body mass index (BMI) 25.0-25.9, adult: Secondary | ICD-10-CM | POA: Diagnosis not present

## 2020-08-09 DIAGNOSIS — Z01419 Encounter for gynecological examination (general) (routine) without abnormal findings: Secondary | ICD-10-CM | POA: Diagnosis not present

## 2020-09-28 DIAGNOSIS — Z20822 Contact with and (suspected) exposure to covid-19: Secondary | ICD-10-CM | POA: Diagnosis not present

## 2020-09-28 DIAGNOSIS — Z03818 Encounter for observation for suspected exposure to other biological agents ruled out: Secondary | ICD-10-CM | POA: Diagnosis not present

## 2020-10-06 ENCOUNTER — Telehealth (INDEPENDENT_AMBULATORY_CARE_PROVIDER_SITE_OTHER): Payer: BC Managed Care – PPO | Admitting: Internal Medicine

## 2020-10-06 ENCOUNTER — Encounter: Payer: Self-pay | Admitting: Internal Medicine

## 2020-10-06 VITALS — Ht 61.5 in | Wt 138.0 lb

## 2020-10-06 DIAGNOSIS — J0120 Acute ethmoidal sinusitis, unspecified: Secondary | ICD-10-CM

## 2020-10-06 MED ORDER — BETAMETHASONE VALERATE 0.1 % EX CREA: TOPICAL_CREAM | CUTANEOUS | 0 refills | Status: DC

## 2020-10-06 MED ORDER — AZITHROMYCIN 250 MG PO TABS: ORAL_TABLET | ORAL | 0 refills | Status: DC

## 2020-10-06 MED ORDER — PREDNISONE 20 MG PO TABS: 40.0000 mg | ORAL_TABLET | Freq: Every day | ORAL | 0 refills | Status: DC

## 2020-10-06 NOTE — Progress Notes (Signed)
Virtual Visit via Video Note  I connected with Kerry Garrett  on 10/06/20 at 11:45 AM EST by a video enabled telemedicine application and verified that I am speaking with the correct person using two identifiers.  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 The patient expressed understanding and agreed to proceed.   HPI: 1. Dx covid 09/27/20 with sinus pressure around nose and burning as only sx and h/a no fever, no sob tried flonase, claritin, sudafed  Entire family had covid 19 she is feeling better except for sinus pressure    ROS: See pertinent positives and negatives per HPI.  Past Medical History:  Diagnosis Date  . Allergy    Claritin controls and Flonase   . Asthma    as a CHILD- NO PROBELM AS ADULT  . FH: melanoma    gets annual exams- Dr.Isenstein  . GERD (gastroesophageal reflux disease)    Diet controlled  . Migraines     Past Surgical History:  Procedure Laterality Date  . CESAREAN SECTION    . TONSILLECTOMY  1987  . TUBAL LIGATION       Current Outpatient Medications:  .  AUROVELA FE 1/20 1-20 MG-MCG tablet, Take 1 tablet by mouth daily., Disp: , Rfl:  .  azithromycin (ZITHROMAX) 250 MG tablet, 2 pills day 1 and 1 pill day 2-5 with food, Disp: 6 tablet, Rfl: 0 .  betamethasone valerate (VALISONE) 0.1 % cream, betamethasone valerate 0.1 % topical cream  APPLY THIN LAYER TOPICALLY TO THE AFFECTED AREA EVERY DAY, Disp: , Rfl:  .  fluticasone (FLONASE) 50 MCG/ACT nasal spray, Place into both nostrils daily., Disp: , Rfl:  .  loratadine (CLARITIN) 10 MG tablet, , Disp: , Rfl:  .  melatonin 3 MG TABS tablet, Take 3 mg by mouth at bedtime., Disp: , Rfl:  .  norethindrone-ethinyl estradiol (LOESTRIN) 1-20 MG-MCG tablet, Take 1 tablet by mouth daily., Disp: , Rfl:   .  predniSONE (DELTASONE) 20 MG tablet, Take 2 tablets (40 mg total) by mouth daily with breakfast. X 5-10 days with food, Disp: 20 tablet, Rfl: 0  EXAM:  VITALS per patient if applicable:  GENERAL: alert, oriented, appears well and in no acute distress  HEENT: atraumatic, conjunttiva clear, no obvious abnormalities on inspection of external nose and ears  NECK: normal movements of the head and neck  LUNGS: on inspection no signs of respiratory distress, breathing rate appears normal, no obvious gross SOB, gasping or wheezing  CV: no obvious cyanosis  MS: moves all visible extremities without noticeable abnormality  PSYCH/NEURO: pleasant and cooperative, no obvious depression or anxiety, speech and thought processing grossly intact  ASSESSMENT AND PLAN:  Discussed the following assessment and plan:  Acute non-recurrent ethmoidal sinusitis - Plan: predniSONE (DELTASONE) 40 MG tablet qd x 5-10 days, azithromycin (ZITHROMAX) 250 MG tablet There is no medication other than over the counter meds:  Mucinex dm green label for cough.  Vitamin C 1000 mg daily.  Vitamin D3 4000 Iu (units) daily.  Zinc 100 mg daily.  Quercetin 250-500 mg 2 times per day   Elderberry  Oil of oregano  cepacol or chloroseptic spray  Warm tea with honey and lemon  Hydration  Try to eat though you dont feel like it   Tylenol or Advil  Nasal saline  Flonase  Over  the counter antihistamine claritin/zyrtec/allegra   Monitor pulse oximeter, buy from [facility] if oxygen is less than 90 please go to the hospital.        Are you feeling really sick? Shortness of breath, cough, chest pain?, dizziness? Confusion   If so let me know  If worsening, go to hospital or [facility] clinic Urgent care for further treatment    -we discussed possible serious and likely etiologies, options for evaluation and workup, limitations of telemedicine visit vs in person visit, treatment, treatment risks and precautions.    I  discussed the assessment and treatment plan with the patient. The patient was provided an opportunity to ask questions and all were answered. The patient agreed with the plan and demonstrated an understanding of the instructions.    Time 20 min Bevelyn Buckles, MD

## 2020-10-06 NOTE — Patient Instructions (Addendum)
There is no medication other than over the counter meds:  Mucinex dm green label for cough.  Vitamin C 1000 mg daily.  Vitamin D3 4000 Iu (units) daily.  Zinc 100 mg daily.  Quercetin 250-500 mg 2 times per day   Elderberry  Oil of oregano  cepacol or chloroseptic spray for sore throat Warm tea with honey and lemon  Hydration  Try to eat though you dont feel like it   Tylenol or Advil for sinus pain Nasal saline  Flonase  Over the counter antihistamine claritin/zyrtec/allegra Monitor pulse oximeter, buy from [place] if oxygen is less than 90 please go to the hospital.        Are you feeling really sick? Shortness of breath, cough, chest pain?, dizziness? Confusion   If so let me know  If worsening, go to hospital or [facility] clinic Urgent care for further treatment

## 2020-10-06 NOTE — Telephone Encounter (Signed)
Pt called in and said the [facility] had a pipe burst and they are closed. She needs all the medications called into [facility] on [address] that were prescribed to her today.

## 2020-10-17 DIAGNOSIS — Z20828 Contact with and (suspected) exposure to other viral communicable diseases: Secondary | ICD-10-CM | POA: Diagnosis not present

## 2020-12-21 ENCOUNTER — Telehealth: Payer: Self-pay | Admitting: Nurse Practitioner

## 2020-12-21 NOTE — Telephone Encounter (Signed)
Patient called and said that her mother, Tommye Standard saw Dr Birdie Sons yesterday. He told patient that he would take daughter and son inlaw as his patients. They are former Sports administrator patients. Just wanted to verify if this is true.  Carmie Lanpher,cma

## 2020-12-21 NOTE — Telephone Encounter (Signed)
Patient called and said that her mother, Kerry Garrett saw Dr Birdie Sons yesterday. He told patient that he would take daughter and son inlaw as his patients. They are former Sports administrator patients.

## 2020-12-21 NOTE — Telephone Encounter (Signed)
This is fine with me.  They can be scheduled for the next available appointment on my schedule that works for them.

## 2020-12-23 NOTE — Telephone Encounter (Signed)
Correction from last note. This will be a TOC from [place], sorry.

## 2020-12-23 NOTE — Telephone Encounter (Signed)
This is fine with me.  They can be scheduled for the next available appointment on my schedule that works for them.  This is the wife and this is from Dr. Hughes Better

## 2020-12-23 NOTE — Telephone Encounter (Signed)
Please schedule new patient appointment with Dr. Birdie Sons.

## 2021-02-15 ENCOUNTER — Other Ambulatory Visit: Payer: Self-pay

## 2021-02-15 ENCOUNTER — Telehealth (INDEPENDENT_AMBULATORY_CARE_PROVIDER_SITE_OTHER): Payer: 59 | Admitting: Family Medicine

## 2021-02-15 ENCOUNTER — Encounter: Payer: Self-pay | Admitting: Family Medicine

## 2021-02-15 DIAGNOSIS — R059 Cough, unspecified: Secondary | ICD-10-CM

## 2021-02-15 DIAGNOSIS — E538 Deficiency of other specified B group vitamins: Secondary | ICD-10-CM | POA: Diagnosis not present

## 2021-02-15 NOTE — Progress Notes (Signed)
Virtual Visit via video Note  This visit type was conducted due to national recommendations for restrictions regarding the COVID-19 pandemic (e.g. social distancing).  This format is felt to be most appropriate for this patient at this time.  All issues noted in this document were discussed and addressed.  No physical exam was performed (except for noted visual exam findings with Video Visits).   I connected with Susann Givens today at 10:30 AM EDT by a video enabled telemedicine application and verified that I am speaking with the correct person using two identifiers. Location patient: home Location provider: work Persons participating in the virtual visit: patient, provider  I discussed the limitations, risks, security and privacy concerns of performing an evaluation and management service by telephone and the availability of in person appointments. I also discussed with the patient that there may be a patient responsible charge related to this service. The patient expressed understanding and agreed to proceed.  Reason for visit: follow-up  HPI: Sore throat: Patient notes onset of symptoms yesterday with sore throat, postnasal drip, and cough.  She does feel stuffy.  She notes no fever, shortness of breath, taste or smell disturbances.  Her kids have been sick recently with the same symptoms and they have seen their pediatrician and had multiple COVID tests as well as a strep test that were all negative.  They were placed on azithromycin for respiratory infection.  She has no known COVID exposures.  She has not been vaccinated against COVID-19.  She has been using Mucinex, Claritin, and Flonase.  She does have a history of allergies.  B12 deficiency: She has taking vitamin B12 by mouth.   ROS: See pertinent positives and negatives per HPI.  Past Medical History:  Diagnosis Date  . Allergy    Claritin controls and Flonase   . Asthma    as a CHILD- NO PROBELM AS ADULT  . COVID-19     09/27/20  . FH: melanoma    gets annual exams- Dr.Isenstein  . GERD (gastroesophageal reflux disease)    Diet controlled  . Migraines     Past Surgical History:  Procedure Laterality Date  . CESAREAN SECTION    . TONSILLECTOMY  1987  . TUBAL LIGATION      Family History  Problem Relation Age of Onset  . Cancer Mother   . Cancer Father   . Asthma Son   . Cancer Maternal Grandmother   . Depression Maternal Grandmother   . Cancer Maternal Grandfather     SOCIAL HX: nonsmoker   Current Outpatient Medications:  .  AUROVELA FE 1/20 1-20 MG-MCG tablet, Take 1 tablet by mouth daily., Disp: , Rfl:  .  azithromycin (ZITHROMAX) 250 MG tablet, 2 pills day 1 and 1 pill day 2-5 with food, Disp: 6 tablet, Rfl: 0 .  betamethasone valerate (VALISONE) 0.1 % cream, betamethasone valerate 0.1 % topical cream  APPLY THIN LAYER TOPICALLY TO THE AFFECTED AREA EVERY DAY, Disp: 30 g, Rfl: 0 .  cyclobenzaprine (FLEXERIL) 10 MG tablet, Take 1 tablet by mouth at bedtime., Disp: , Rfl:  .  fluticasone (FLONASE) 50 MCG/ACT nasal spray, Place into both nostrils daily., Disp: , Rfl:  .  loratadine (CLARITIN) 10 MG tablet, , Disp: , Rfl:  .  melatonin 3 MG TABS tablet, Take 3 mg by mouth at bedtime., Disp: , Rfl:  .  meloxicam (MOBIC) 15 MG tablet, Take 1 tablet by mouth daily., Disp: , Rfl:  .  norethindrone-ethinyl estradiol (  LOESTRIN) 1-20 MG-MCG tablet, Take 1 tablet by mouth daily., Disp: , Rfl:  .  predniSONE (DELTASONE) 20 MG tablet, Take 2 tablets (40 mg total) by mouth daily with breakfast. X 5-10 days with food, Disp: 20 tablet, Rfl: 0  EXAM:  VITALS per patient if applicable:  GENERAL: alert, oriented, appears well and in no acute distress  HEENT: atraumatic, conjunttiva clear, no obvious abnormalities on inspection of external nose and ears  NECK: normal movements of the head and neck  LUNGS: on inspection no signs of respiratory distress, breathing rate appears normal, no obvious gross  SOB, gasping or wheezing  CV: no obvious cyanosis  MS: moves all visible extremities without noticeable abnormality  PSYCH/NEURO: pleasant and cooperative, no obvious depression or anxiety, speech and thought processing grossly intact  ASSESSMENT AND PLAN:  Discussed the following assessment and plan:  Problem List Items Addressed This Visit    Cough    Patient likely has a virus.  The knee most likely cause would be what ever her kids had recently though I did discuss there is a potential this could be COVID-19.  She has a home test which I encouraged her to complete tomorrow given the timeframe of her symptoms.  Discussed staying home at least until she gets her negative COVID test result back.  If she tests positive she will let us know.  Discussed supportive care with continued Mucinex, Claritin, and Flonase.  Advised to seek medical attention for shortness of breath, elevated fevers, and cough productive of blood.  She will let us know if she has worsening symptoms.  She will let us know if she wants anything for her cough.      B12 deficiency    She will remain on her oral B12 supplement.  Her levels did improve on recheck last year.  We will plan on rechecking labs at her yearly exam around October 1.         Return in about 4 months (around 06/17/2021) for CPE.   I discussed the assessment and treatment plan with the patient. The patient was provided an opportunity to ask questions and all were answered. The patient agreed with the plan and demonstrated an understanding of the instructions.   The patient was advised to call back or seek an in-person evaluation if the symptoms worsen or if the condition fails to improve as anticipated.    Marikay Alar, MD

## 2021-02-15 NOTE — Assessment & Plan Note (Signed)
Patient likely has a virus.  The knee most likely cause would be what ever her kids had recently though I did discuss there is a potential this could be COVID-19.  She has a home test which I encouraged her to complete tomorrow given the timeframe of her symptoms.  Discussed staying home at least until she gets her negative COVID test result back.  If she tests positive she will let us know.  Discussed supportive care with continued Mucinex, Claritin, and Flonase.  Advised to seek medical attention for shortness of breath, elevated fevers, and cough productive of blood.  She will let us know if she has worsening symptoms.  She will let us know if she wants anything for her cough.

## 2021-02-15 NOTE — Assessment & Plan Note (Signed)
She will remain on her oral B12 supplement.  Her levels did improve on recheck last year.  We will plan on rechecking labs at her yearly exam around October 1.

## 2021-02-22 ENCOUNTER — Telehealth: Payer: Self-pay | Admitting: Family Medicine

## 2021-02-22 NOTE — Telephone Encounter (Signed)
I called the patient and LVM for her to call back see what symptoms she is having.  Kerry Garrett,cma

## 2021-02-22 NOTE — Telephone Encounter (Signed)
PT called to advise that she saw Dr.Sonnenberg on June 1st for a Mychart Video Visit. She states the symptoms still exist and they have gotten worse. She would like to see what can be done as they cough is more congested and gets worse at night. She states her kids got a zpak and that worked and not sure if that will help her.

## 2021-02-22 NOTE — Telephone Encounter (Signed)
Please confirm the specific symptoms she is currently having.  Is she congested in her sinuses or in her chest?  Has she had any fevers?  Has she had any shortness of breath?  Has she been coughing up any blood?  The answers to these questions will help me pick an antibiotic for her and to determine if we need an x-ray or other work-up.  Thanks.

## 2021-02-22 NOTE — Addendum Note (Signed)
Addended by: Glori Luis on: 02/22/2021 03:58 PM   Modules accepted: Orders

## 2021-02-22 NOTE — Telephone Encounter (Signed)
LVM for patient to call back.   Jourdan Durbin,cma  

## 2021-02-23 MED ORDER — AMOXICILLIN-POT CLAVULANATE 875-125 MG PO TABS: 1.0000 | ORAL_TABLET | Freq: Two times a day (BID) | ORAL | 0 refills | Status: DC

## 2021-02-23 NOTE — Addendum Note (Signed)
Addended by: Glori Luis on: 02/23/2021 09:47 AM   Modules accepted: Orders

## 2021-02-23 NOTE — Telephone Encounter (Signed)
Noted. I sent augmentin in for the patient to start on to cover for a possible sinus infection. If she is not improving with this we should complete a follow-up virtual visit. Thanks.

## 2021-02-23 NOTE — Telephone Encounter (Signed)
Patient returned a call at 5:02pm for Kerry Garrett. Providing Access Nurse Documentation.

## 2021-02-23 NOTE — Telephone Encounter (Signed)
Patient called ma back and stated she is congested in her sinuses and chest, no fevers, no SOB and no coughing up blood.  Patient stated her cough is usually worse at night.  Maxten Shuler,cma

## 2021-02-23 NOTE — Telephone Encounter (Signed)
Patient is aware 

## 2021-05-18 ENCOUNTER — Telehealth: Payer: Self-pay | Admitting: Family Medicine

## 2021-05-18 DIAGNOSIS — E538 Deficiency of other specified B group vitamins: Secondary | ICD-10-CM

## 2021-05-18 DIAGNOSIS — Z13220 Encounter for screening for lipoid disorders: Secondary | ICD-10-CM

## 2021-05-18 DIAGNOSIS — E663 Overweight: Secondary | ICD-10-CM

## 2021-05-18 NOTE — Telephone Encounter (Signed)
Patient is calling in to see if she needs a lab appointment before her appointment 10/ for her physical.Please advise.

## 2021-05-19 NOTE — Telephone Encounter (Signed)
Patient is calling in to see if she needs a lab appointment before her appointment 10/ for her physical.Please advise.  Goldman Birchall,cma

## 2021-05-19 NOTE — Telephone Encounter (Signed)
I called and spoke with the patient and she is scheduled for labs.  Shaquayla Klimas,cma

## 2021-05-19 NOTE — Telephone Encounter (Signed)
Orders placed for labs.  She can be scheduled 2 days prior to her physical.

## 2021-06-16 ENCOUNTER — Other Ambulatory Visit: Payer: Self-pay

## 2021-06-16 ENCOUNTER — Other Ambulatory Visit (INDEPENDENT_AMBULATORY_CARE_PROVIDER_SITE_OTHER): Payer: 59

## 2021-06-16 DIAGNOSIS — Z13220 Encounter for screening for lipoid disorders: Secondary | ICD-10-CM | POA: Diagnosis not present

## 2021-06-16 DIAGNOSIS — E663 Overweight: Secondary | ICD-10-CM | POA: Diagnosis not present

## 2021-06-16 DIAGNOSIS — E538 Deficiency of other specified B group vitamins: Secondary | ICD-10-CM | POA: Diagnosis not present

## 2021-06-16 LAB — COMPREHENSIVE METABOLIC PANEL
ALT: 29 U/L (ref 0–35)
AST: 24 U/L (ref 0–37)
Albumin: 4.2 g/dL (ref 3.5–5.2)
Alkaline Phosphatase: 52 U/L (ref 39–117)
BUN: 19 mg/dL (ref 6–23)
CO2: 31 mEq/L (ref 19–32)
Calcium: 9.3 mg/dL (ref 8.4–10.5)
Chloride: 104 mEq/L (ref 96–112)
Creatinine, Ser: 0.8 mg/dL (ref 0.40–1.20)
GFR: 85.97 mL/min (ref >60.00)
Glucose, Bld: 76 mg/dL (ref 70–99)
Potassium: 4.4 mEq/L (ref 3.5–5.1)
Sodium: 140 mEq/L (ref 135–145)
Total Bilirubin: 0.5 mg/dL (ref 0.2–1.2)
Total Protein: 6.6 g/dL (ref 6.0–8.3)

## 2021-06-16 LAB — LIPID PANEL
Cholesterol: 182 mg/dL (ref 0–200)
HDL: 54.4 mg/dL (ref >39.00)
LDL Cholesterol: 108 mg/dL — ABNORMAL HIGH (ref 0–99)
NonHDL: 127.82
Total CHOL/HDL Ratio: 3
Triglycerides: 98 mg/dL (ref 0.0–149.0)
VLDL: 19.6 mg/dL (ref 0.0–40.0)

## 2021-06-16 LAB — VITAMIN B12: Vitamin B-12: 1435 pg/mL — ABNORMAL HIGH (ref 211–911)

## 2021-06-16 LAB — HEMOGLOBIN A1C: Hgb A1c MFr Bld: 5.6 % (ref 4.6–6.5)

## 2021-06-19 ENCOUNTER — Ambulatory Visit (INDEPENDENT_AMBULATORY_CARE_PROVIDER_SITE_OTHER): Payer: 59 | Admitting: Family Medicine

## 2021-06-19 ENCOUNTER — Encounter: Payer: Self-pay | Admitting: Family Medicine

## 2021-06-19 ENCOUNTER — Other Ambulatory Visit: Payer: Self-pay

## 2021-06-19 VITALS — BP 120/70 | HR 69 | Temp 97.5°F | Ht 62.0 in | Wt 152.2 lb

## 2021-06-19 DIAGNOSIS — K582 Mixed irritable bowel syndrome: Secondary | ICD-10-CM | POA: Insufficient documentation

## 2021-06-19 DIAGNOSIS — Z0001 Encounter for general adult medical examination with abnormal findings: Secondary | ICD-10-CM

## 2021-06-19 DIAGNOSIS — R6882 Decreased libido: Secondary | ICD-10-CM

## 2021-06-19 DIAGNOSIS — G44229 Chronic tension-type headache, not intractable: Secondary | ICD-10-CM

## 2021-06-19 DIAGNOSIS — E538 Deficiency of other specified B group vitamins: Secondary | ICD-10-CM | POA: Diagnosis not present

## 2021-06-19 DIAGNOSIS — R42 Dizziness and giddiness: Secondary | ICD-10-CM

## 2021-06-19 DIAGNOSIS — G8929 Other chronic pain: Secondary | ICD-10-CM | POA: Insufficient documentation

## 2021-06-19 DIAGNOSIS — G4710 Hypersomnia, unspecified: Secondary | ICD-10-CM | POA: Diagnosis not present

## 2021-06-19 NOTE — Assessment & Plan Note (Signed)
Recent headaches have been improving.  These may be hormonally mediated given that they typically would occur when she came off of her contraceptive pills.  She will monitor now that she is fully off of her pills.  If they do not continue to improve she will let us know.

## 2021-06-19 NOTE — Progress Notes (Signed)
Marikay Alar, MD Phone: [phone]  Kerry Garrett is a 50 y.o. female who presents today for CPE.  Diet: not as healthy over the summer, has gotten back to eating healthier over the past 3 weeks Exercise: 3x/week Pap smear: through GYN Colonoscopy: due Mammogram: through GYN Family history-  Colon cancer: no  Breast cancer: paternal great aunt  Ovarian cancer: unsure, reports possible genetic testing in the past with GYN Menses: none in the past year, though was on OCP until one month ago Vaccines-   Flu: declines  Tetanus: reports in the past year  Shingles: considering  COVID19: declines HIV screening: UTD Hep C Screening: UTD Tobacco use: no Alcohol use: no Illicit Drug use: no Dentist: yes Ophthalmology: yes  Chronic headaches: Patient notes chronic intermittent issues with frontal headaches that would typically occur around the time of her menstrual cycles.  She notes she would come off of her contraceptive pill at that time and the headaches would start.  She discussed this with her GYN and they felt as though they were menstrual migraines.  She notes they placed her on continuous OCPs at that time and that did help.  She noted about a month ago she decided to come off of the contraceptive pill as she had not had a menstrual cycle in a year.  She has not had a menstrual cycle since coming off of the OCP.  She notes headaches most days after stopping the OCP though does note over the last week or so they have started to improve.  Hypersomnia: The patient does report tiredness.  She gets 7 hours of sleep nightly and wakes up well rested though she is tired during the day and oftentimes need to take a nap.  She notes no apneic episodes. Epworth sleepiness scale score of 11 with individual scores of 2, 1, 0, 3, 3, 0, 2, 0  Decreased libido: She reports decreased sex drive over the past year.  No pain with intercourse.  No depression or anxiety.  Vertigo: Patient notes  at times she was having some vertiginous issues when she was upright and would move her head.  She notes that has improved recently.  IBS: Patient reports she was advised she might have IBS mixed type about a year ago.  She intermittently has issues with constipation and diarrhea.  At times she will get intestinal cramping and discomfort in the middle of the night that will require suppositories.  That occurs less than once a month.  Typically she has a bowel movement every 1 to 2 days.  Active Ambulatory Problems    Diagnosis Date Noted   Encounter for general adult medical examination with abnormal findings 06/10/2020   FH: melanoma 06/10/2020   Allergic rhinitis 06/10/2020   Rectal spasm 06/10/2020   Immunizations incomplete 06/10/2020   History of 2019 novel coronavirus disease (COVID-19) 06/10/2020   Cough 02/15/2021   B12 deficiency 02/15/2021   Hypersomnia 06/19/2021   Irritable bowel syndrome with both constipation and diarrhea 06/19/2021   Decreased libido 06/19/2021   Chronic headaches 06/19/2021   Vertigo 06/19/2021   Resolved Ambulatory Problems    Diagnosis Date Noted   No Resolved Ambulatory Problems   Past Medical History:  Diagnosis Date   Allergy    Asthma    COVID-19    GERD (gastroesophageal reflux disease)    Migraines     Family History  Problem Relation Age of Onset   Cancer Mother    Cancer Father  Asthma Son    Cancer Maternal Grandmother    Depression Maternal Grandmother    Cancer Maternal Grandfather     Social History   Socioeconomic History   Marital status: Married    Spouse name: Not on file   Number of children: 3   Years of education: Not on file   Highest education level: Master's degree (e.g., MA, MS, MEng, MEd, MSW, MBA)  Occupational History   Not on file  Tobacco Use   Smoking status: Never   Smokeless tobacco: Never  Vaping Use   Vaping Use: Never used  Substance and Sexual Activity   Alcohol use: Not Currently    Drug use: Never   Sexual activity: Yes    Partners: Male    Birth control/protection: Pill, Surgical  Other Topics Concern   Not on file  Social History Narrative   Married with  3 kids- ages 21-19-14   She enjoys the beach, swims, social   Social Determinants of Health   Financial Resource Strain: Not on file  Food Insecurity: Not on file  Transportation Needs: Not on file  Physical Activity: Not on file  Stress: Not on file  Social Connections: Not on file  Intimate Partner Violence: Not on file    ROS  General:  Negative for nexplained weight loss, fever Skin: Negative for new or changing mole, sore that won't heal HEENT: Negative for trouble hearing, trouble seeing, ringing in ears, mouth sores, hoarseness, change in voice, dysphagia. CV:  Negative for chest pain, dyspnea, edema, palpitations Resp: Negative for cough, dyspnea, hemoptysis GI: Positive for constipation, diarrhea, negative for nausea, vomiting, abdominal pain, melena, hematochezia. GU: Negative for dysuria, incontinence, urinary hesitance, hematuria, vaginal or penile discharge, polyuria, sexual difficulty, lumps in testicle or breasts MSK: Positive for muscle cramps or aches, negative for joint pain or swelling Neuro: Positive for headaches, dizziness, negative for weakness, numbness, passing out/fainting Psych: Negative for depression, anxiety, memory problems  Objective  Physical Exam Vitals:   06/19/21 1109  BP: 120/70  Pulse: 69  Temp: (!) 97.5 F (36.4 C)  SpO2: 99%    BP Readings from Last 3 Encounters:  06/19/21 120/70  06/10/20 104/62   Wt Readings from Last 3 Encounters:  06/19/21 152 lb 3.2 oz (69 kg)  02/15/21 140 lb (63.5 kg)  10/06/20 138 lb (62.6 kg)    Physical Exam Constitutional:      General: She is not in acute distress.    Appearance: She is not diaphoretic.  HENT:     Right Ear: Tympanic membrane normal.     Left Ear: Tympanic membrane normal.  Eyes:      Conjunctiva/sclera: Conjunctivae normal.     Pupils: Pupils are equal, round, and reactive to light.  Cardiovascular:     Rate and Rhythm: Normal rate and regular rhythm.     Heart sounds: Normal heart sounds.  Pulmonary:     Effort: Pulmonary effort is normal.     Breath sounds: Normal breath sounds.  Abdominal:     General: Bowel sounds are normal. There is no distension.     Palpations: Abdomen is soft.     Tenderness: There is no abdominal tenderness. There is no guarding or rebound.  Musculoskeletal:     Right lower leg: No edema.     Left lower leg: No edema.  Lymphadenopathy:     Cervical: No cervical adenopathy.  Skin:    General: Skin is warm and dry.  Neurological:  Mental Status: She is alert.     Comments: EOMI, PERRL, opens and closes eyes adequately, V1 through V3 intact to light touch bilaterally, hearing intact to finger rub, shoulder shrug intact, 5/5 strength in bilateral biceps, triceps, grip, quads, hamstrings, plantar and dorsiflexion, sensation to light touch intact in bilateral UE and LE, normal gait     Assessment/Plan:   Problem List Items Addressed This Visit     B12 deficiency    B12 mildly elevated.  Discussed she could try the B12 supplement every other day.      Chronic headaches    Recent headaches have been improving.  These may be hormonally mediated given that they typically would occur when she came off of her contraceptive pills.  She will monitor now that she is fully off of her pills.  If they do not continue to improve she will let us know.      Decreased libido    The patient is possibly perimenopausal or menopausal.  This could be playing a role in her decreased libido.  I encouraged her to discuss further with her GYN.      Encounter for general adult medical examination with abnormal findings - Primary    Physical exam completed.  I encouraged continuing with her recent dietary changes and exercise changes.  She will confirm  whether or not a family member had ovarian cancer.  She will discuss further with GYN if they did.  She will have her mammogram through GYN.  Pap smear to be completed through GYN.  Breast exam and pelvic exam deferred to GYN.  Referral for colonoscopy placed.  She will check on her tetanus vaccine date.  She declines flu vaccine and COVID-vaccine.  She will consider the Shingrix vaccine and will check with her insurance.  Recent lab work ordered by me has been reviewed.      Hypersomnia    Concerning for potential sleep apnea.  Home sleep study ordered.  We will get this scheduled for her.  She was advised to contact us if she does not hear about this within a week.      Relevant Orders   Home sleep test   Irritable bowel syndrome with both constipation and diarrhea    Patient with likely mild IBS.  GI referral placed to determine if any further treatment or evaluation is needed other than a colonoscopy.      Relevant Orders   Ambulatory referral to Gastroenterology   Vertigo    Improved at this time.  She will monitor for recurrence.       Return in about 3 months (around 09/19/2021) for hypersomnia, f/u post sleep study.  This visit occurred during the SARS-CoV-2 public health emergency.  Safety protocols were in place, including screening questions prior to the visit, additional usage of staff PPE, and extensive cleaning of exam room while observing appropriate contact time as indicated for disinfecting solutions.    Marikay Alar, MD [facility]

## 2021-06-19 NOTE — Assessment & Plan Note (Signed)
Patient with likely mild IBS.  GI referral placed to determine if any further treatment or evaluation is needed other than a colonoscopy.

## 2021-06-19 NOTE — Assessment & Plan Note (Signed)
Concerning for potential sleep apnea.  Home sleep study ordered.  We will get this scheduled for her.  She was advised to contact us if she does not hear about this within a week.

## 2021-06-19 NOTE — Assessment & Plan Note (Signed)
Physical exam completed.  I encouraged continuing with her recent dietary changes and exercise changes.  She will confirm whether or not a family member had ovarian cancer.  She will discuss further with GYN if they did.  She will have her mammogram through GYN.  Pap smear to be completed through GYN.  Breast exam and pelvic exam deferred to GYN.  Referral for colonoscopy placed.  She will check on her tetanus vaccine date.  She declines flu vaccine and COVID-vaccine.  She will consider the Shingrix vaccine and will check with her insurance.  Recent lab work ordered by me has been reviewed.

## 2021-06-19 NOTE — Assessment & Plan Note (Signed)
The patient is possibly perimenopausal or menopausal.  This could be playing a role in her decreased libido.  I encouraged her to discuss further with her GYN.

## 2021-06-19 NOTE — Patient Instructions (Addendum)
Nice to see you. Please continue with your recent diet and exercise changes.  We will get you to see GI for the possible IBS and for a colonoscopy. Someone will contact you to set up the sleep study.

## 2021-06-19 NOTE — Assessment & Plan Note (Signed)
B12 mildly elevated.  Discussed she could try the B12 supplement every other day.

## 2021-06-19 NOTE — Assessment & Plan Note (Signed)
Improved at this time.  She will monitor for recurrence.

## 2021-07-11 ENCOUNTER — Ambulatory Visit: Payer: 59 | Attending: Otolaryngology

## 2021-07-11 DIAGNOSIS — G4710 Hypersomnia, unspecified: Secondary | ICD-10-CM | POA: Diagnosis present

## 2021-07-18 ENCOUNTER — Other Ambulatory Visit: Payer: Self-pay

## 2021-07-24 ENCOUNTER — Encounter: Payer: Self-pay | Admitting: Family Medicine

## 2021-08-16 DIAGNOSIS — Z8489 Family history of other specified conditions: Secondary | ICD-10-CM | POA: Insufficient documentation

## 2021-08-16 DIAGNOSIS — G43909 Migraine, unspecified, not intractable, without status migrainosus: Secondary | ICD-10-CM | POA: Insufficient documentation

## 2021-08-21 ENCOUNTER — Ambulatory Visit (INDEPENDENT_AMBULATORY_CARE_PROVIDER_SITE_OTHER): Payer: 59 | Admitting: Gastroenterology

## 2021-08-21 ENCOUNTER — Encounter: Payer: Self-pay | Admitting: Gastroenterology

## 2021-08-21 VITALS — BP 102/68 | HR 64 | Temp 98.0°F | Ht 62.0 in | Wt 153.8 lb

## 2021-08-21 DIAGNOSIS — K601 Chronic anal fissure: Secondary | ICD-10-CM

## 2021-08-21 DIAGNOSIS — Z1211 Encounter for screening for malignant neoplasm of colon: Secondary | ICD-10-CM | POA: Diagnosis not present

## 2021-08-21 DIAGNOSIS — R21 Rash and other nonspecific skin eruption: Secondary | ICD-10-CM | POA: Diagnosis not present

## 2021-08-21 DIAGNOSIS — K640 First degree hemorrhoids: Secondary | ICD-10-CM | POA: Diagnosis not present

## 2021-08-21 MED ORDER — CLOTRIMAZOLE-BETAMETHASONE 1-0.05 % EX CREA: 1.0000 "application " | TOPICAL_CREAM | Freq: Two times a day (BID) | CUTANEOUS | 0 refills | Status: DC

## 2021-08-21 MED ORDER — NA SULFATE-K SULFATE-MG SULF 17.5-3.13-1.6 GM/177ML PO SOLN: 1.0000 | Freq: Once | ORAL | 0 refills | Status: AC

## 2021-08-21 NOTE — Addendum Note (Signed)
Addended by: Linward Foster on: 08/21/2021 03:15 PM   Modules accepted: Orders

## 2021-08-21 NOTE — Addendum Note (Signed)
Addended by: Linward Foster on: 08/21/2021 03:48 PM   Modules accepted: Orders

## 2021-08-21 NOTE — Progress Notes (Signed)
[name], MD [address]  [address]  [place], [place] [address]  Main: [phone]  Fax: [phone]    Gastroenterology Consultation  Referring Provider:     Glori Luis, MD Primary Care Physician:  Glori Luis, MD Primary Gastroenterologist:  Dr. [name] Reason for Consultation: Rectal pain and ?IBS        HPI:   Kerry Garrett is a 50 y.o. female referred by Dr. Birdie Sons, Yehuda Mao, MD  for consultation & management of rectal pain.  Patient reports that for last 6 months, she has been experiencing severe rectal discomfort during a bowel movement which leads to not having regular bowel movements.  She does report sensation of incomplete emptying.  She reports sporadic episodes of intense lower abdominal discomfort followed by bowel evacuation after using glycerin suppository.  These episodes occur every 3 months or so during middle of the night.  She does acknowledge eating adequate amount of fiber and drinking adequate amount of water.  She does notice bright red blood per rectum on wiping.  She does have intense perianal itching and burning.  She did have history of B12 deficiency which resolved with oral B12 supplements.  Celiac disease panel negative.  No evidence of anemia.  TSH normal  Patient does not smoke or drink alcohol History of C-section She denies family history of GI malignancy  NSAIDs: None  Antiplts/Anticoagulants/Anti thrombotics: None  GI Procedures: None  Past Medical History:  Diagnosis Date   Allergy    Claritin controls and Flonase    Asthma    as a CHILD- NO PROBELM AS ADULT   COVID-19    09/27/20   FH: melanoma    gets annual exams- Dr.Isenstein   GERD (gastroesophageal reflux disease)    Diet controlled   Migraines     Past Surgical History:  Procedure Laterality Date   CESAREAN SECTION     TONSILLECTOMY  1987   TUBAL LIGATION      Current Outpatient Medications:    betamethasone valerate  (VALISONE) 0.1 % cream, betamethasone valerate 0.1 % topical cream  APPLY THIN LAYER TOPICALLY TO THE AFFECTED AREA EVERY DAY, Disp: 30 g, Rfl: 0   clotrimazole-betamethasone (LOTRISONE) cream, Apply 1 application topically 2 (two) times daily., Disp: 30 g, Rfl: 0   fluticasone (FLONASE) 50 MCG/ACT nasal spray, Place into both nostrils daily., Disp: , Rfl:    loratadine (CLARITIN) 10 MG tablet, , Disp: , Rfl:    melatonin 3 MG TABS tablet, Take 3 mg by mouth at bedtime., Disp: , Rfl:    meloxicam (MOBIC) 15 MG tablet, Take 1 tablet by mouth daily., Disp: , Rfl:    AUROVELA FE 1/20 1-20 MG-MCG tablet, Take 1 tablet by mouth daily. (Patient not taking: Reported on 08/21/2021), Disp: , Rfl:     Family History  Problem Relation Age of Onset   Cancer Mother    Cancer Father    Asthma Son    Cancer Maternal Grandmother    Depression Maternal Grandmother    Cancer Maternal Grandfather      Social History   Tobacco Use   Smoking status: Never   Smokeless tobacco: Never  Vaping Use   Vaping Use: Never used  Substance Use Topics   Alcohol use: Not Currently   Drug use: Never    Allergies as of 08/21/2021 - Review Complete 08/21/2021  Allergen Reaction Noted   Erythromycin base  06/10/2020   Lidocaine Rash  04/02/2014    Review of Systems:    All systems reviewed and negative except where noted in HPI.   Physical Exam:  BP 102/68 (BP Location: Left Arm, Patient Position: Sitting, Cuff Size: Normal)   Pulse 64   Temp 98 F (36.7 C) (Oral)   Ht 5\' 2"  (1.575 m)   Wt 153 lb 12.8 oz (69.8 kg)   BMI 28.13 kg/m  No LMP recorded. (Menstrual status: Oral contraceptives).  General:   Alert,  Well-developed, well-nourished, pleasant and cooperative in NAD Head:  Normocephalic and atraumatic. Eyes:  Sclera clear, no icterus.   Conjunctiva pink. Ears:  Normal auditory acuity. Nose:  No deformity, discharge, or lesions. Mouth:  No deformity or lesions,oropharynx pink & moist. Neck:   Supple; no masses or thyromegaly. Lungs:  Respirations even and unlabored.  Clear throughout to auscultation.   No wheezes, crackles, or rhonchi. No acute distress. Heart:  Regular rate and rhythm; no murmurs, clicks, rubs, or gallops. Abdomen:  Normal bowel sounds. Soft, non-tender and non-distended without masses, hepatosplenomegaly or hernias noted.  No guarding or rebound tenderness.   Rectal: Perianal erythematous rash, digital rectal exam revealed point tenderness in the posterior wall of the anal canal Msk:  Symmetrical without gross deformities. Good, equal movement & strength bilaterally. Pulses:  Normal pulses noted. Extremities:  No clubbing or edema.  No cyanosis. Neurologic:  Alert and oriented x3;  grossly normal neurologically. Psych:  Alert and cooperative. Normal mood and affect.  Imaging Studies: No abdominal imaging  Assessment and Plan:   Kerry Garrett is a 50 y.o. pleasant Caucasian female with history of IBS, 6 months history of rectal pain and perianal itching  Rectal pain Rectal exam consistent with posterior anal fissure Recommend 0.125% nitroglycerin with 5% lidocaine, instructions provided  Perianal itching Trial of Lotrisone cream Calmoseptine samples provided  Grade 1 symptomatic hemorrhoids Discussed about outpatient hemorrhoid ligation after trial of nitroglycerin paste for anal fissure  Colon cancer screening Schedule colonoscopy in early January   Follow up via MyChart and if needed after the colonoscopy   February, MD

## 2021-08-21 NOTE — Patient Instructions (Signed)
[facility] [address], [place] [address].   Phone number [phone].   Please allow at least 24 hours before picking up the compounded cream because the pharmacy has to make the medication.  

## 2021-09-28 ENCOUNTER — Other Ambulatory Visit: Payer: Self-pay

## 2021-09-28 ENCOUNTER — Ambulatory Visit: Payer: 59 | Admitting: Anesthesiology

## 2021-09-28 ENCOUNTER — Ambulatory Visit
Admission: RE | Admit: 2021-09-28 | Discharge: 2021-09-28 | Disposition: A | Discharge status: Home / Self Care | Payer: 59 | Attending: Gastroenterology | Admitting: Gastroenterology

## 2021-09-28 ENCOUNTER — Encounter: Payer: Self-pay | Admitting: Gastroenterology

## 2021-09-28 ENCOUNTER — Encounter
Admission: RE | Disposition: A | Discharge status: Home / Self Care | Payer: Self-pay | Source: Home / Self Care | Attending: Gastroenterology

## 2021-09-28 DIAGNOSIS — K644 Residual hemorrhoidal skin tags: Secondary | ICD-10-CM | POA: Diagnosis not present

## 2021-09-28 DIAGNOSIS — K5730 Diverticulosis of large intestine without perforation or abscess without bleeding: Secondary | ICD-10-CM | POA: Insufficient documentation

## 2021-09-28 DIAGNOSIS — K635 Polyp of colon: Secondary | ICD-10-CM | POA: Diagnosis not present

## 2021-09-28 DIAGNOSIS — Z1211 Encounter for screening for malignant neoplasm of colon: Secondary | ICD-10-CM | POA: Insufficient documentation

## 2021-09-28 HISTORY — PX: COLONOSCOPY WITH PROPOFOL: SHX5780

## 2021-09-28 LAB — POCT PREGNANCY, URINE: Preg Test, Ur: NEGATIVE

## 2021-09-28 SURGERY — COLONOSCOPY WITH PROPOFOL
Anesthesia: General

## 2021-09-28 MED ORDER — PROPOFOL 10 MG/ML IV BOLUS
INTRAVENOUS | Status: DC | PRN
  Administered 2021-09-28: 50 mg via INTRAVENOUS
  Administered 2021-09-28: 40 mg via INTRAVENOUS
  Administered 2021-09-28: 60 mg via INTRAVENOUS

## 2021-09-28 MED ORDER — SODIUM CHLORIDE 0.9 % IV SOLN: INTRAVENOUS | Status: DC

## 2021-09-28 MED ORDER — PROPOFOL 500 MG/50ML IV EMUL
INTRAVENOUS | Status: DC | PRN
  Administered 2021-09-28: 200 ug/kg/min via INTRAVENOUS

## 2021-09-28 NOTE — Transfer of Care (Signed)
Immediate Anesthesia Transfer of Care Note  Patient: NAJA RUGE  Procedure(s) Performed: COLONOSCOPY WITH PROPOFOL  Patient Location: Endoscopy Unit  Anesthesia Type:General  Level of Consciousness: sedated  Airway & Oxygen Therapy: Patient Spontanous Breathing  Post-op Assessment: Report given to RN and Post -op Vital signs reviewed and stable  Post vital signs: Reviewed and stable  Last Vitals:  Vitals Value Taken Time  BP    Temp    Pulse 72 09/28/21 0851  Resp 19 09/28/21 0851  SpO2 100 % 09/28/21 0851  Vitals shown include unvalidated device data.  Last Pain:  Vitals:   09/28/21 0800  TempSrc: Temporal  PainSc: 0-No pain         Complications: No notable events documented.

## 2021-09-28 NOTE — Op Note (Signed)
[facility] Patient Name: Kerry Garrett Procedure Date: 09/28/2021 8:12 AM MRN: [id number] Account #: [id number] Date of Birth: 10-23-1970 Admit Type: Outpatient Age: 51 Room: [facility] ENDO ROOM 4 Gender: Female Note Status: Finalized Instrument Name: Park Meo [id number] Procedure:             Colonoscopy Indications:           Screening for colorectal malignant neoplasm, This is                         the patient's first colonoscopy Providers:             Lin Landsman MD, MD Referring MD:          Marquite Adam. Caryl Bis (Referring MD) Medicines:             General Anesthesia Complications:         No immediate complications. Estimated blood loss: None. Procedure:             Pre-Anesthesia Assessment:                        - Prior to the procedure, a History and Physical was                         performed, and patient medications and allergies were                         reviewed. The patient is competent. The risks and                         benefits of the procedure and the sedation options and                         risks were discussed with the patient. All questions                         were answered and informed consent was obtained.                         Patient identification and proposed procedure were                         verified by the physician, the nurse, the                         anesthesiologist, the anesthetist and the technician                         in the pre-procedure area in the procedure room in the                         endoscopy suite. Mental Status Examination: alert and                         oriented. Airway Examination: normal oropharyngeal                         airway and neck mobility. Respiratory Examination:  clear to auscultation. CV Examination: normal.                         Prophylactic Antibiotics: The patient does not require                          prophylactic antibiotics. Prior Anticoagulants: The                         patient has taken no previous anticoagulant or                         antiplatelet agents. ASA Grade Assessment: II - A                         patient with mild systemic disease. After reviewing                         the risks and benefits, the patient was deemed in                         satisfactory condition to undergo the procedure. The                         anesthesia plan was to use general anesthesia.                         Immediately prior to administration of medications,                         the patient was re-assessed for adequacy to receive                         sedatives. The heart rate, respiratory rate, oxygen                         saturations, blood pressure, adequacy of pulmonary                         ventilation, and response to care were monitored                         throughout the procedure. The physical status of the                         patient was re-assessed after the procedure.                        After obtaining informed consent, the colonoscope was                         passed under direct vision. Throughout the procedure,                         the patient's blood pressure, pulse, and oxygen                         saturations were monitored continuously. The  Colonoscope was introduced through the anus and                         advanced to the the terminal ileum, with                         identification of the appendiceal orifice and IC                         valve. The colonoscopy was performed without                         difficulty. The patient tolerated the procedure well.                         The quality of the bowel preparation was evaluated                         using the BBPS [facility] Bowel Preparation Scale) with                         scores of: Right Colon = 3, Transverse Colon = 3 and                          Left Colon = 3 (entire mucosa seen well with no                         residual staining, small fragments of stool or opaque                         liquid). The total BBPS score equals 9. Findings:      The perianal and digital rectal examinations were normal. Pertinent       negatives include normal sphincter tone and no palpable rectal lesions.      The terminal ileum appeared normal.      A 3 mm polyp was found in the cecum. The polyp was sessile. The polyp       was removed with a cold snare. Resection and retrieval were complete.      A few diverticula were found in the sigmoid colon.      Non-bleeding external hemorrhoids were found during retroflexion. The       hemorrhoids were medium-sized. Impression:            - The examined portion of the ileum was normal.                        - One 3 mm polyp in the cecum, removed with a cold                         snare. Resected and retrieved.                        - Diverticulosis in the sigmoid colon.                        - Non-bleeding external hemorrhoids. Recommendation:        - Discharge patient to home (with escort).                        -  Resume previous diet today.                        - Continue present medications.                        - Await pathology results.                        - Repeat colonoscopy in 7-10 years for surveillance                         based on pathology results.                        - Return to my office PRN. Procedure Code(s):     --- Professional ---                        [phone], Colonoscopy, flexible; with removal of                         tumor(s), polyp(s), or other lesion(s) by snare                         technique Diagnosis Code(s):     --- Professional ---                        Z12.11, Encounter for screening for malignant neoplasm                         of colon                        K64.4, Residual hemorrhoidal skin tags                        K63.5, Polyp of colon                         K57.30, Diverticulosis of large intestine without                         perforation or abscess without bleeding CPT copyright 2019 American Medical Association. All rights reserved. The codes documented in this report are preliminary and upon coder review may  be revised to meet current compliance requirements. Dr. Ulyess Mort Lin Landsman MD, MD 09/28/2021 8:47:55 AM This report has been signed electronically. Number of Addenda: 0 Note Initiated On: 09/28/2021 8:12 AM Scope Withdrawal Time: 0 hours 8 minutes 31 seconds  Total Procedure Duration: 0 hours 11 minutes 22 seconds  Estimated Blood Loss:  Estimated blood loss: none.      [facility]

## 2021-09-28 NOTE — H&P (Signed)
Kerry Repress, MD [address]  [address]  [place], [place] [address]  Main: [phone]  Fax: [phone] Pager: [phone]  Primary Care Physician:  Kerry Luis, MD Primary Gastroenterologist:  Dr. Arlyss Garrett  Pre-Procedure History & Physical: HPI:  Kerry Garrett is a 51 y.o. female is here for an colonoscopy.   Past Medical History:  Diagnosis Date   Allergy    Claritin controls and Flonase    Asthma    as a CHILD- NO PROBELM AS ADULT   COVID-19    09/27/20   FH: melanoma    gets annual exams- Dr.Isenstein   GERD (gastroesophageal reflux disease)    Diet controlled   Migraines     Past Surgical History:  Procedure Laterality Date   CESAREAN SECTION     TONSILLECTOMY  1987   TUBAL LIGATION      Prior to Admission medications   Medication Sig Start Date End Date Taking? Authorizing Provider  loratadine (CLARITIN) 10 MG tablet    Yes [provider]  meloxicam (MOBIC) 15 MG tablet Take 1 tablet by mouth daily. 02/14/21  Yes [provider]  AUROVELA FE 1/20 1-20 MG-MCG tablet Take 1 tablet by mouth daily. Patient not taking: Reported on 08/21/2021 06/07/20   [provider]  betamethasone valerate (VALISONE) 0.1 % cream betamethasone valerate 0.1 % topical cream  APPLY THIN LAYER TOPICALLY TO THE AFFECTED AREA EVERY DAY 10/06/20   Kerry Garrett, Kerry Spillers, MD  clotrimazole-betamethasone (LOTRISONE) cream Apply 1 application topically 2 (two) times daily. 08/21/21   Kerry Reil, MD  fluticasone (FLONASE) 50 MCG/ACT nasal spray Place into both nostrils daily.    [provider]  melatonin 3 MG TABS tablet Take 3 mg by mouth at bedtime.    [provider]    Allergies as of 08/22/2021 - Review Complete 08/21/2021  Allergen Reaction Noted   Erythromycin base  06/10/2020   Lidocaine Rash 04/02/2014    Family History  Problem Relation Age of Onset   Cancer Mother    Cancer Father    Asthma  Son    Cancer Maternal Grandmother    Depression Maternal Grandmother    Cancer Maternal Grandfather     Social History   Socioeconomic History   Marital status: Married    Spouse name: Not on file   Number of children: 3   Years of education: Not on file   Highest education level: Master's degree (e.g., MA, MS, MEng, MEd, MSW, MBA)  Occupational History   Not on file  Tobacco Use   Smoking status: Never   Smokeless tobacco: Never  Vaping Use   Vaping Use: Never used  Substance and Sexual Activity   Alcohol use: Not Currently   Drug use: Never   Sexual activity: Yes    Partners: Male    Birth control/protection: Pill, Surgical  Other Topics Concern   Not on file  Social History Narrative   Married with  3 kids- ages 21-19-14   She enjoys the beach, swims, social   Social Determinants of Health   Financial Resource Strain: Not on file  Food Insecurity: Not on file  Transportation Needs: Not on file  Physical Activity: Not on file  Stress: Not on file  Social Connections: Not on file  Intimate Partner Violence: Not on file    Review of Systems: See HPI, otherwise negative ROS  Physical Exam: BP 138/67    Pulse 75  Temp (!) 97.1 F (36.2 C) (Temporal)    Resp 19    Ht 5\' 2"  (1.575 m)    Wt 65.8 kg    SpO2 100%    BMI 26.52 kg/m  General:   Alert,  pleasant and cooperative in NAD Head:  Normocephalic and atraumatic. Neck:  Supple; no masses or thyromegaly. Lungs:  Clear throughout to auscultation.    Heart:  Regular rate and rhythm. Abdomen:  Soft, nontender and nondistended. Normal bowel sounds, without guarding, and without rebound.   Neurologic:  Alert and  oriented x4;  grossly normal neurologically.  Impression/Plan: Kerry Garrett is here for an colonoscopy to be performed for colon cancer screening  Risks, benefits, limitations, and alternatives regarding  colonoscopy have been reviewed with the patient.  Questions have been answered.  All parties  agreeable.   Diamantina Monks, MD  09/28/2021, 8:08 AM

## 2021-09-28 NOTE — Anesthesia Preprocedure Evaluation (Signed)
Anesthesia Evaluation  Patient identified by MRN, date of birth, ID band Patient awake    Reviewed: Allergy & Precautions, H&P , NPO status , Patient's Chart, lab work & pertinent test results, reviewed documented beta blocker date and time   History of Anesthesia Complications Negative for: history of anesthetic complications  Airway Mallampati: II  TM Distance: >3 FB Neck ROM: full    Dental  (+) Dental Advidsory Given, Caps, Teeth Intact   Pulmonary neg shortness of breath, asthma (as child) , neg sleep apnea, neg COPD, neg recent URI,    Pulmonary exam normal breath sounds clear to auscultation       Cardiovascular Exercise Tolerance: Good negative cardio ROS Normal cardiovascular exam Rhythm:regular Rate:Normal     Neuro/Psych  Headaches, neg Seizures negative psych ROS   GI/Hepatic Neg liver ROS, GERD  Controlled,  Endo/Other  negative endocrine ROS  Renal/GU negative Renal ROS  negative genitourinary   Musculoskeletal   Abdominal   Peds  Hematology negative hematology ROS (+)   Anesthesia Other Findings Past Medical History: No date: Allergy     Comment:  Claritin controls and Flonase  No date: Asthma     Comment:  as a CHILD- NO PROBELM AS ADULT No date: COVID-19     Comment:  09/27/20 No date: FH: melanoma     Comment:  gets annual exams- Dr.Isenstein No date: GERD (gastroesophageal reflux disease)     Comment:  Diet controlled No date: Migraines   Reproductive/Obstetrics negative OB ROS                             Anesthesia Physical Anesthesia Plan  ASA: 2  Anesthesia Plan: General   Post-op Pain Management:    Induction: Intravenous  PONV Risk Score and Plan: 3 and Propofol infusion and TIVA  Airway Management Planned: Natural Airway and Nasal Cannula  Additional Equipment:   Intra-op Plan:   Post-operative Plan:   Informed Consent: I have reviewed the  patients History and Physical, chart, labs and discussed the procedure including the risks, benefits and alternatives for the proposed anesthesia with the patient or authorized representative who has indicated his/her understanding and acceptance.     Dental Advisory Given  Plan Discussed with: Anesthesiologist, CRNA and Surgeon  Anesthesia Plan Comments:         Anesthesia Quick Evaluation

## 2021-09-29 ENCOUNTER — Encounter: Payer: Self-pay | Admitting: Gastroenterology

## 2021-09-29 LAB — SURGICAL PATHOLOGY

## 2021-09-30 NOTE — Anesthesia Postprocedure Evaluation (Signed)
Anesthesia Post Note  Patient: Kerry Garrett  Procedure(s) Performed: COLONOSCOPY WITH PROPOFOL  Patient location during evaluation: Endoscopy Anesthesia Type: General Level of consciousness: awake and alert Pain management: pain level controlled Vital Signs Assessment: post-procedure vital signs reviewed and stable Respiratory status: spontaneous breathing, nonlabored ventilation, respiratory function stable and patient connected to nasal cannula oxygen Cardiovascular status: blood pressure returned to baseline and stable Postop Assessment: no apparent nausea or vomiting Anesthetic complications: no   No notable events documented.   Last Vitals:  Vitals:   09/28/21 0800 09/28/21 0852  BP: 138/67 (!) 89/50  Pulse: 75   Resp: 19   Temp: (!) 36.2 C (!) 36.1 C  SpO2: 100%     Last Pain:  Vitals:   09/28/21 0922  TempSrc:   PainSc: 0-No pain                 Martha Clan

## 2021-10-01 ENCOUNTER — Encounter: Payer: Self-pay | Admitting: Gastroenterology

## 2021-10-17 ENCOUNTER — Ambulatory Visit: Payer: 59 | Admitting: Family Medicine

## 2021-12-12 DIAGNOSIS — M7662 Achilles tendinitis, left leg: Secondary | ICD-10-CM | POA: Diagnosis not present

## 2021-12-12 DIAGNOSIS — M7072 Other bursitis of hip, left hip: Secondary | ICD-10-CM | POA: Diagnosis not present

## 2022-03-26 ENCOUNTER — Other Ambulatory Visit: Payer: Self-pay

## 2022-03-26 DIAGNOSIS — R21 Rash and other nonspecific skin eruption: Secondary | ICD-10-CM

## 2022-03-26 MED ORDER — CLOTRIMAZOLE-BETAMETHASONE 1-0.05 % EX CREA: 1.0000 | TOPICAL_CREAM | Freq: Two times a day (BID) | CUTANEOUS | 0 refills | Status: DC

## 2022-03-26 NOTE — Telephone Encounter (Signed)
Last office visit 08/21/21 Chronic posterior anal fissure  Last refill 08/21/21 0 refills

## 2022-06-13 ENCOUNTER — Other Ambulatory Visit: Payer: Self-pay | Admitting: Gastroenterology

## 2022-06-13 DIAGNOSIS — R21 Rash and other nonspecific skin eruption: Secondary | ICD-10-CM

## 2022-07-11 DIAGNOSIS — D2272 Melanocytic nevi of left lower limb, including hip: Secondary | ICD-10-CM | POA: Diagnosis not present

## 2022-07-11 DIAGNOSIS — D2261 Melanocytic nevi of right upper limb, including shoulder: Secondary | ICD-10-CM | POA: Diagnosis not present

## 2022-07-11 DIAGNOSIS — L821 Other seborrheic keratosis: Secondary | ICD-10-CM | POA: Diagnosis not present

## 2022-07-11 DIAGNOSIS — L814 Other melanin hyperpigmentation: Secondary | ICD-10-CM | POA: Diagnosis not present

## 2022-07-11 DIAGNOSIS — D225 Melanocytic nevi of trunk: Secondary | ICD-10-CM | POA: Diagnosis not present

## 2022-07-11 DIAGNOSIS — D2262 Melanocytic nevi of left upper limb, including shoulder: Secondary | ICD-10-CM | POA: Diagnosis not present

## 2022-07-11 DIAGNOSIS — L918 Other hypertrophic disorders of the skin: Secondary | ICD-10-CM | POA: Diagnosis not present

## 2022-07-11 DIAGNOSIS — I8391 Asymptomatic varicose veins of right lower extremity: Secondary | ICD-10-CM | POA: Diagnosis not present

## 2022-07-11 DIAGNOSIS — D2271 Melanocytic nevi of right lower limb, including hip: Secondary | ICD-10-CM | POA: Diagnosis not present

## 2022-09-25 DIAGNOSIS — Z1231 Encounter for screening mammogram for malignant neoplasm of breast: Secondary | ICD-10-CM | POA: Diagnosis not present

## 2022-09-25 DIAGNOSIS — N939 Abnormal uterine and vaginal bleeding, unspecified: Secondary | ICD-10-CM | POA: Diagnosis not present

## 2022-09-25 DIAGNOSIS — Z01419 Encounter for gynecological examination (general) (routine) without abnormal findings: Secondary | ICD-10-CM | POA: Diagnosis not present

## 2022-09-25 DIAGNOSIS — Z6825 Body mass index (BMI) 25.0-25.9, adult: Secondary | ICD-10-CM | POA: Diagnosis not present

## 2022-10-04 DIAGNOSIS — N950 Postmenopausal bleeding: Secondary | ICD-10-CM | POA: Diagnosis not present

## 2022-10-04 DIAGNOSIS — N939 Abnormal uterine and vaginal bleeding, unspecified: Secondary | ICD-10-CM | POA: Diagnosis not present

## 2023-01-26 DIAGNOSIS — Z6826 Body mass index (BMI) 26.0-26.9, adult: Secondary | ICD-10-CM | POA: Diagnosis not present

## 2023-01-26 DIAGNOSIS — H9202 Otalgia, left ear: Secondary | ICD-10-CM | POA: Diagnosis not present

## 2023-06-20 DIAGNOSIS — M25562 Pain in left knee: Secondary | ICD-10-CM | POA: Diagnosis not present

## 2023-06-20 DIAGNOSIS — M7711 Lateral epicondylitis, right elbow: Secondary | ICD-10-CM | POA: Diagnosis not present

## 2023-07-12 DIAGNOSIS — D2261 Melanocytic nevi of right upper limb, including shoulder: Secondary | ICD-10-CM | POA: Diagnosis not present

## 2023-07-12 DIAGNOSIS — D2272 Melanocytic nevi of left lower limb, including hip: Secondary | ICD-10-CM | POA: Diagnosis not present

## 2023-07-12 DIAGNOSIS — D2271 Melanocytic nevi of right lower limb, including hip: Secondary | ICD-10-CM | POA: Diagnosis not present

## 2023-07-12 DIAGNOSIS — D2262 Melanocytic nevi of left upper limb, including shoulder: Secondary | ICD-10-CM | POA: Diagnosis not present

## 2023-07-12 DIAGNOSIS — D225 Melanocytic nevi of trunk: Secondary | ICD-10-CM | POA: Diagnosis not present

## 2023-07-12 DIAGNOSIS — L821 Other seborrheic keratosis: Secondary | ICD-10-CM | POA: Diagnosis not present

## 2023-07-18 ENCOUNTER — Telehealth: Payer: Self-pay

## 2023-07-18 NOTE — Telephone Encounter (Signed)
Noted  

## 2023-07-18 NOTE — Telephone Encounter (Signed)
Patient states she has been experiencing vertigo since 07/16/2023.  Patient states when she gets up from lying down she gets dizzy, and if she tilts her head back too far, she gets dizzy at that time too. Also, when she lays down she gets dizzy.

## 2023-07-19 ENCOUNTER — Encounter: Payer: Self-pay | Admitting: Family Medicine

## 2023-07-19 ENCOUNTER — Ambulatory Visit: Payer: 59 | Admitting: Family Medicine

## 2023-07-19 VITALS — BP 112/78 | HR 72 | Temp 98.5°F | Ht 62.0 in | Wt 153.0 lb

## 2023-07-19 DIAGNOSIS — H8113 Benign paroxysmal vertigo, bilateral: Secondary | ICD-10-CM | POA: Diagnosis not present

## 2023-07-19 MED ORDER — MECLIZINE HCL 12.5 MG PO TABS: 12.5000 mg | ORAL_TABLET | Freq: Three times a day (TID) | ORAL | 0 refills | Status: DC | PRN

## 2023-07-19 NOTE — Progress Notes (Signed)
  Marikay Alar, MD Phone: [phone]  Kerry Garrett is a 52 y.o. female who presents today for same day visit.   Vertigo: Patient notes onset of symptoms 07/16/2023.  Has room spinning sensation with any head position changes.  Notices more on the right than the left.  Last less than a minute.  No tinnitus or ear fullness.  No heat recent illnesses.  Does have a history of vertigo in the past.  Social History   Tobacco Use  Smoking Status Never  Smokeless Tobacco Never    Current Outpatient Medications on File Prior to Visit  Medication Sig Dispense Refill   betamethasone valerate (VALISONE) 0.1 % cream betamethasone valerate 0.1 % topical cream  APPLY THIN LAYER TOPICALLY TO THE AFFECTED AREA EVERY DAY 30 g 0   clotrimazole-betamethasone (LOTRISONE) cream APPLY TO AFFECTED AREA TWICE A DAY 30 g 0   fluticasone (FLONASE) 50 MCG/ACT nasal spray Place into both nostrils daily.     loratadine (CLARITIN) 10 MG tablet      melatonin 3 MG TABS tablet Take 3 mg by mouth at bedtime.     meloxicam (MOBIC) 15 MG tablet Take 1 tablet by mouth daily.     No current facility-administered medications on file prior to visit.     ROS see history of present illness  Objective  Physical Exam Vitals:   07/19/23 1435  BP: 112/78  Pulse: 72  Temp: 98.5 F (36.9 C)  SpO2: 98%    BP Readings from Last 3 Encounters:  07/19/23 112/78  09/28/21 (!) 89/50  08/21/21 102/68   Wt Readings from Last 3 Encounters:  07/19/23 153 lb (69.4 kg)  09/28/21 145 lb (65.8 kg)  08/21/21 153 lb 12.8 oz (69.8 kg)    Physical Exam Constitutional:      General: She is not in acute distress.    Appearance: She is not diaphoretic.  HENT:     Right Ear: Tympanic membrane normal.     Left Ear: Tympanic membrane normal.     Ears:     Comments: Positive Dix-Hallpike bilaterally Cardiovascular:     Rate and Rhythm: Normal rate and regular rhythm.     Heart sounds: Normal heart sounds.   Pulmonary:     Effort: Pulmonary effort is normal.     Breath sounds: Normal breath sounds.  Skin:    General: Skin is warm and dry.  Neurological:     Mental Status: She is alert.      Assessment/Plan: Please see individual problem list.  BPPV (benign paroxysmal positional vertigo), bilateral Assessment & Plan: Symptoms and exam are consistent with BPPV.  Discussed modified Epley maneuver to do at home and this was demonstrated to the patient.  We will give her meclizine to take as prescribed to help with any dizziness that occurs while she is working through the exercises in the early days.  If not improving within the next week she will let us know and we can send her for vestibular rehab.   Other orders -     Meclizine HCl; Take 1 tablet (12.5 mg total) by mouth 3 (three) times daily as needed for dizziness.  Dispense: 30 tablet; Refill: 0     Return in about 6 months (around 01/16/2024) for transfer of care.   Marikay Alar, MD [facility]

## 2023-07-19 NOTE — Assessment & Plan Note (Signed)
Symptoms and exam are consistent with BPPV.  Discussed modified Epley maneuver to do at home and this was demonstrated to the patient.  We will give her meclizine to take as prescribed to help with any dizziness that occurs while she is working through the exercises in the early days.  If not improving within the next week she will let us know and we can send her for vestibular rehab.

## 2023-07-19 NOTE — Patient Instructions (Signed)
How to Perform the Epley Maneuver The Epley maneuver is an exercise that relieves symptoms of vertigo. Vertigo is the feeling that you or your surroundings are moving when they are not. When you feel vertigo, you may feel like the room is spinning and may have trouble walking. The Epley maneuver is used for a type of vertigo caused by a calcium deposit in a part of the inner ear. The maneuver involves changing head positions to help the deposit move out of the area. You can do this maneuver at home whenever you have symptoms of vertigo. You can repeat it in 24 hours if your vertigo has not gone away. Even though the Epley maneuver may relieve your vertigo for a few weeks, it is possible that your symptoms will return. This maneuver relieves vertigo, but it does not relieve dizziness. What are the risks? If it is done correctly, the Epley maneuver is considered safe. Sometimes it can lead to dizziness or nausea that goes away after a short time. If you develop other symptoms--such as changes in vision, weakness, or numbness--stop doing the maneuver and call your health care provider. Supplies needed: A bed or table. A pillow. How to do the Epley maneuver     Sit on the edge of a bed or table with your back straight and your legs extended or hanging over the edge of the bed or table. Turn your head halfway toward the affected ear or side as told by your health care provider. Lie backward quickly with your head turned until you are lying flat on your back. Your head should dangle (head-hanging position). You may want to position a pillow under your shoulders. Hold this position for at least 30 seconds. If you feel dizzy or have symptoms of vertigo, continue to hold the position until the symptoms stop. Turn your head to the opposite direction until your unaffected ear is facing down. Your head should continue to dangle. Hold this position for at least 30 seconds. If you feel dizzy or have symptoms of  vertigo, continue to hold the position until the symptoms stop. Turn your whole body to the same side as your head so that you are positioned on your side. Your head will now be nearly facedown and no longer needs to dangle. Hold for at least 30 seconds. If you feel dizzy or have symptoms of vertigo, continue to hold the position until the symptoms stop. Sit back up. You can repeat the maneuver in 24 hours if your vertigo does not go away. Follow these instructions at home: For 24 hours after doing the Epley maneuver: Keep your head in an upright position. When lying down to sleep or rest, keep your head raised (elevated) with two or more pillows. Avoid excessive neck movements. Activity Do not drive or use machinery if you feel dizzy. After doing the Epley maneuver, return to your normal activities as told by your health care provider. Ask your health care provider what activities are safe for you. General instructions Drink enough fluid to keep your urine pale yellow. Do not drink alcohol. Take over-the-counter and prescription medicines only as told by your health care provider. Keep all follow-up visits. This is important. Preventing vertigo symptoms Ask your health care provider if there is anything you should do at home to prevent vertigo. He or she may recommend that you: Keep your head elevated with two or more pillows while you sleep. Do not sleep on the side of your affected ear. Get   up slowly from bed. Avoid sudden movements during the day. Avoid extreme head positions or movement, such as looking up or bending over. Contact a health care provider if: Your vertigo gets worse. You have other symptoms, including: Nausea. Vomiting. Headache. Get help right away if you: Have vision changes. Have a headache or neck pain that is severe or getting worse. Cannot stop vomiting. Have new numbness or weakness in any part of your body. These symptoms may represent a serious problem  that is an emergency. Do not wait to see if the symptoms will go away. Get medical help right away. Call your local emergency services (911 in the [place] Do not drive yourself to the hospital. Summary Vertigo is the feeling that you or your surroundings are moving when they are not. The Epley maneuver is an exercise that relieves symptoms of vertigo. If the Epley maneuver is done correctly, it is considered safe. This information is not intended to replace advice given to you by your health care provider. Make sure you discuss any questions you have with your health care provider. Document Revised: 08/03/2020 Document Reviewed: 08/03/2020 Elsevier Patient Education  2024 [employer]  

## 2023-07-31 DIAGNOSIS — H8123 Vestibular neuronitis, bilateral: Secondary | ICD-10-CM | POA: Diagnosis not present

## 2023-07-31 DIAGNOSIS — H903 Sensorineural hearing loss, bilateral: Secondary | ICD-10-CM | POA: Diagnosis not present

## 2023-08-06 DIAGNOSIS — H8112 Benign paroxysmal vertigo, left ear: Secondary | ICD-10-CM | POA: Diagnosis not present

## 2023-08-13 DIAGNOSIS — H8112 Benign paroxysmal vertigo, left ear: Secondary | ICD-10-CM | POA: Diagnosis not present

## 2023-09-21 DIAGNOSIS — Z6827 Body mass index (BMI) 27.0-27.9, adult: Secondary | ICD-10-CM | POA: Diagnosis not present

## 2023-09-21 DIAGNOSIS — H66002 Acute suppurative otitis media without spontaneous rupture of ear drum, left ear: Secondary | ICD-10-CM | POA: Diagnosis not present

## 2023-10-21 ENCOUNTER — Telehealth: Payer: Self-pay

## 2023-10-21 NOTE — Telephone Encounter (Signed)
I spoke with patient and let her know that Dr. Dale Sundown is willing to see her as a patient.  Patient states she is in a meeting right now, so she will call us back to schedule the transfer of care appointment with Dr. Lorin Picket.  When patient calls back, please schedule a transfer of care appointment for her with Dr. Dale .

## 2023-10-29 DIAGNOSIS — Z01419 Encounter for gynecological examination (general) (routine) without abnormal findings: Secondary | ICD-10-CM | POA: Diagnosis not present

## 2023-10-29 DIAGNOSIS — Z1151 Encounter for screening for human papillomavirus (HPV): Secondary | ICD-10-CM | POA: Diagnosis not present

## 2023-10-29 DIAGNOSIS — Z124 Encounter for screening for malignant neoplasm of cervix: Secondary | ICD-10-CM | POA: Diagnosis not present

## 2023-10-29 DIAGNOSIS — Z1231 Encounter for screening mammogram for malignant neoplasm of breast: Secondary | ICD-10-CM | POA: Diagnosis not present

## 2023-10-29 DIAGNOSIS — Z6828 Body mass index (BMI) 28.0-28.9, adult: Secondary | ICD-10-CM | POA: Diagnosis not present

## 2023-11-01 ENCOUNTER — Other Ambulatory Visit: Payer: Self-pay | Admitting: Obstetrics and Gynecology

## 2023-11-01 DIAGNOSIS — R928 Other abnormal and inconclusive findings on diagnostic imaging of breast: Secondary | ICD-10-CM

## 2023-11-08 ENCOUNTER — Encounter: Payer: Self-pay | Admitting: Obstetrics and Gynecology

## 2023-11-13 ENCOUNTER — Ambulatory Visit: Payer: 59

## 2023-11-13 ENCOUNTER — Ambulatory Visit
Admission: RE | Admit: 2023-11-13 | Discharge: 2023-11-13 | Disposition: A | Discharge status: Home / Self Care | Payer: 59 | Source: Ambulatory Visit | Attending: Obstetrics and Gynecology | Admitting: Obstetrics and Gynecology

## 2023-11-13 DIAGNOSIS — R928 Other abnormal and inconclusive findings on diagnostic imaging of breast: Secondary | ICD-10-CM

## 2023-12-25 LAB — LAB REPORT - SCANNED
A1c: 5.5
EGFR (Non-African Amer.): 85
Free T4: 0.91 ng/dL
TSH: 3.11 (ref 0.41–5.90)

## 2024-02-11 ENCOUNTER — Ambulatory Visit: Payer: 59 | Admitting: Internal Medicine

## 2024-02-11 VITALS — BP 122/70 | HR 77 | Temp 98.0°F | Resp 16 | Ht 62.0 in | Wt 160.0 lb

## 2024-02-11 DIAGNOSIS — E538 Deficiency of other specified B group vitamins: Secondary | ICD-10-CM

## 2024-02-11 DIAGNOSIS — Z1211 Encounter for screening for malignant neoplasm of colon: Secondary | ICD-10-CM | POA: Diagnosis not present

## 2024-02-11 DIAGNOSIS — R0683 Snoring: Secondary | ICD-10-CM | POA: Diagnosis not present

## 2024-02-11 DIAGNOSIS — H8113 Benign paroxysmal vertigo, bilateral: Secondary | ICD-10-CM | POA: Diagnosis not present

## 2024-02-11 DIAGNOSIS — N926 Irregular menstruation, unspecified: Secondary | ICD-10-CM

## 2024-02-11 NOTE — Progress Notes (Signed)
 Subjective:    Patient ID: Kerry Garrett, female    DOB: July 25, 1971, 53 y.o.   MRN: [id number]  Patient here for  Chief Complaint  Patient presents with   Transitions Of Care    HPI Here for transfer of care. Former pt of Dr Lovetta Rucks. [facility]es [facility] - for gyn care. Recently evaluated for room spinning sensation (07/2023). Felt to be c/w BPPV. Saw Dr Tommye Franc s/p Epley. Doing better overall. Still will notice some sensation change with position changes. Has had varying periods. Last - 12/17/23 - spotting x 5 days. (Prior to this 04/2023). Is followed by gyn. S/p what sounds like and endometrial biopsy - ok. She is UTD with pap smears. Has three children - two daughters (65 and 72) and one son 42. S/p C section x 1. Tries to stay active. Feels soreness at the end of the day - like she has worked out. Just had labs - ordered by Dr Clarnce Crow. 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 Was started on current hormone therapy and supplements.  Just started. Has f/u labs scheduled for 8 weeks. She does snore. Some fatigue - feeling as if worked out - as outlined. Discussed possible sleep apnea.    Past Medical History:  Diagnosis Date   Allergy    Claritin controls and Flonase    Asthma    as a CHILD- NO PROBELM AS ADULT   COVID-19    09/27/20   FH: melanoma    gets annual exams- Dr.Isenstein   GERD (gastroesophageal reflux disease)    Diet controlled   Migraines    Past Surgical History:  Procedure Laterality Date   CESAREAN SECTION     COLONOSCOPY WITH PROPOFOL  N/A 09/28/2021   Procedure: COLONOSCOPY WITH PROPOFOL ;  Surgeon: Selena Daily, MD;  Location: [facility] ENDOSCOPY;  Service: Gastroenterology;  Laterality: N/A;   TONSILLECTOMY  1987   TUBAL LIGATION     Family History  Problem Relation Age of Onset   Cancer Mother    Cancer Father    Asthma Son    Cancer Maternal Grandmother    Depression Maternal Grandmother    Cancer Maternal Grandfather     Social History   Socioeconomic History   Marital status: Married    Spouse name: Not on file   Number of children: 3   Years of education: Not on file   Highest education level: Master's degree (e.g., MA, MS, MEng, MEd, MSW, MBA)  Occupational History   Not on file  Tobacco Use   Smoking status: Never   Smokeless tobacco: Never  Vaping Use   Vaping status: Never Used  Substance and Sexual Activity   Alcohol use: Not Currently   Drug use: Never   Sexual activity: Yes    Partners: Male    Birth control/protection: Pill, Surgical  Other Topics Concern   Not on file  Social History Narrative   Married with  3 kids- ages 21-19-14   She enjoys the beach, swims, social   Social Drivers of Corporate investment banker Strain: Not on file  Food Insecurity: Not on file  Transportation Needs: Not on file  Physical Activity: Not on file  Stress: Not on file  Social Connections: Unknown (01/30/2022)   Received from [facility], [facility]   Social Network    Social Network: Not on file     Review of Systems  Constitutional:  Positive for fatigue. Negative for appetite change and unexpected weight  change.  HENT:  Negative for congestion and sinus pressure.   Respiratory:  Negative for cough, chest tightness and shortness of breath.   Cardiovascular:  Negative for chest pain, palpitations and leg swelling.  Gastrointestinal:  Negative for abdominal pain, diarrhea, nausea and vomiting.  Genitourinary:  Negative for difficulty urinating and dysuria.  Musculoskeletal:  Negative for joint swelling and myalgias.  Skin:  Negative for color change and rash.  Neurological:  Negative for dizziness and headaches.  Psychiatric/Behavioral:  Negative for agitation and dysphoric mood.        Objective:     BP 122/70   Pulse 77   Temp 98 F (36.7 C)   Resp 16   Ht 5\' 2"  (1.575 m)   Wt 160 lb (72.6 kg)   SpO2 99%   BMI 29.26 kg/m  Wt Readings from Last 3 Encounters:   02/11/24 160 lb (72.6 kg)  07/19/23 153 lb (69.4 kg)  09/28/21 145 lb (65.8 kg)    Physical Exam Vitals reviewed.  Constitutional:      General: She is not in acute distress.    Appearance: Normal appearance.  HENT:     Head: Normocephalic and atraumatic.     Right Ear: External ear normal.     Left Ear: External ear normal.     Mouth/Throat:     Pharynx: No oropharyngeal exudate or posterior oropharyngeal erythema.  Eyes:     General: No scleral icterus.       Right eye: No discharge.        Left eye: No discharge.     Conjunctiva/sclera: Conjunctivae normal.  Neck:     Thyroid : No thyromegaly.  Cardiovascular:     Rate and Rhythm: Normal rate and regular rhythm.  Pulmonary:     Effort: No respiratory distress.     Breath sounds: Normal breath sounds. No wheezing.  Abdominal:     General: Bowel sounds are normal.     Palpations: Abdomen is soft.     Tenderness: There is no abdominal tenderness.  Musculoskeletal:        General: No swelling or tenderness.     Cervical back: Neck supple. No tenderness.  Lymphadenopathy:     Cervical: No cervical adenopathy.  Skin:    Findings: No erythema or rash.  Neurological:     Mental Status: She is alert.  Psychiatric:        Mood and Affect: Mood normal.        Behavior: Behavior normal.         Outpatient Encounter Medications as of 02/11/2024  Medication Sig   DHEA 10 MG CAPS Take 1 capsule by mouth daily.   estradiol (VIVELLE-DOT) 0.025 MG/24HR Place 1 patch onto the skin 2 (two) times a week.   progesterone (PROMETRIUM) 100 MG capsule Take 100 mg by mouth at bedtime.   Cyanocobalamin (VITAMIN B12) 1000 MCG TABS Take 1,000 mcg by mouth daily.   fluticasone (FLONASE) 50 MCG/ACT nasal spray Place into both nostrils daily.   levocetirizine (XYZAL ALLERGY 24HR) 5 MG tablet Take 5 mg by mouth daily.   meclizine  (ANTIVERT ) 12.5 MG tablet Take 1 tablet (12.5 mg total) by mouth 3 (three) times daily as needed for dizziness.    melatonin 3 MG TABS tablet Take 3 mg by mouth at bedtime.   Vitamin D -Vitamin K (D3 + K2 PO) Take 1 capsule by mouth daily.   [DISCONTINUED] betamethasone  valerate (VALISONE ) 0.1 % cream betamethasone  valerate 0.1 % topical cream  APPLY THIN LAYER TOPICALLY TO THE AFFECTED AREA EVERY DAY   [DISCONTINUED] clotrimazole -betamethasone  (LOTRISONE ) cream APPLY TO AFFECTED AREA TWICE A DAY   [DISCONTINUED] loratadine (CLARITIN) 10 MG tablet    [DISCONTINUED] meloxicam (MOBIC) 15 MG tablet Take 1 tablet by mouth daily.   No facility-administered encounter medications on file as of 02/11/2024.     Lab Results  Component Value Date   WBC 6.5 06/13/2020   HGB 13.8 06/13/2020   HCT 40.9 06/13/2020   PLT 210.0 06/13/2020   GLUCOSE 76 06/16/2021   CHOL 182 06/16/2021   TRIG 98.0 06/16/2021   HDL 54.40 06/16/2021   LDLCALC 108 (H) 06/16/2021   ALT 29 06/16/2021   AST 24 06/16/2021   NA 140 06/16/2021   K 4.4 06/16/2021   CL 104 06/16/2021   CREATININE 0.80 06/16/2021   BUN 19 06/16/2021   CO2 31 06/16/2021   TSH 2.14 06/13/2020   HGBA1C 5.6 06/16/2021    MM 3D DIAGNOSTIC MAMMOGRAM UNILATERAL RIGHT BREAST Result Date: 11/13/2023 CLINICAL DATA:  RIGHT asymmetry callback. EXAM: DIGITAL DIAGNOSTIC UNILATERAL RIGHT MAMMOGRAM WITH TOMOSYNTHESIS AND CAD TECHNIQUE: Right digital diagnostic mammography and breast tomosynthesis was performed. The images were evaluated with computer-aided detection. COMPARISON:  Previous exam(s). ACR Breast Density Category b: There are scattered areas of fibroglandular density. FINDINGS: The previously described finding does not persist with additional views, consistent with superimposed fibroglandular tissue. No suspicious mass, microcalcification, or other finding is identified. IMPRESSION: No mammographic evidence of malignancy. RECOMMENDATION: Screening mammogram in one year.(Code:SM-B-01Y) I have discussed the findings and recommendations with the patient. If  applicable, a reminder letter will be sent to the patient regarding the next appointment. BI-RADS CATEGORY  1: Negative. Electronically Signed   By: Clancy Crimes M.D.   On: 11/13/2023 13:19       Assessment & Plan:  B12 deficiency Assessment & Plan: Documented previously. Follow B12 level.    BPPV (benign paroxysmal positional vertigo), bilateral Assessment & Plan: Saw ENT. Evaluated. Epley maneuvers.    Colon cancer screening Assessment & Plan: Colonoscopy 09/28/21 - one 3 mm polyp in the cecum, diverticulosis. F/u colonoscopy in 7-10 years.    Menstrual irregularity Assessment & Plan: Periods as outlined. Followed by gyn. S/p what sounds like endometrial biopsy. Obtain records. Keep menstrual diary.    Snoring Assessment & Plan: Snoring. Fatigue. Discussed possible sleep apnea. Consider sleep study.       Dellar Fenton, MD

## 2024-02-16 ENCOUNTER — Encounter: Payer: Self-pay | Admitting: Internal Medicine

## 2024-02-16 DIAGNOSIS — R0683 Snoring: Secondary | ICD-10-CM | POA: Insufficient documentation

## 2024-02-16 DIAGNOSIS — N926 Irregular menstruation, unspecified: Secondary | ICD-10-CM | POA: Insufficient documentation

## 2024-02-16 NOTE — Assessment & Plan Note (Signed)
 Saw ENT. Evaluated. Epley maneuvers.

## 2024-02-16 NOTE — Assessment & Plan Note (Signed)
 Periods as outlined. Followed by gyn. S/p what sounds like endometrial biopsy. Obtain records. Keep menstrual diary.

## 2024-02-16 NOTE — Assessment & Plan Note (Signed)
 Colonoscopy 09/28/21 - one 3 mm polyp in the cecum, diverticulosis. F/u colonoscopy in 7-10 years.

## 2024-02-16 NOTE — Assessment & Plan Note (Signed)
 Documented previously. Follow B12 level.

## 2024-02-16 NOTE — Assessment & Plan Note (Signed)
 Snoring. Fatigue. Discussed possible sleep apnea. Consider sleep study.

## 2024-03-11 ENCOUNTER — Encounter: Payer: Self-pay | Admitting: Family Medicine

## 2024-03-13 ENCOUNTER — Ambulatory Visit: Admission: EM | Admit: 2024-03-13 | Discharge: 2024-03-13 | Disposition: A | Discharge status: Home / Self Care

## 2024-03-13 DIAGNOSIS — J069 Acute upper respiratory infection, unspecified: Secondary | ICD-10-CM

## 2024-03-13 DIAGNOSIS — R509 Fever, unspecified: Secondary | ICD-10-CM

## 2024-03-13 LAB — POC COVID19/FLU A&B COMBO
Covid Antigen, POC: NEGATIVE
Influenza A Antigen, POC: NEGATIVE
Influenza B Antigen, POC: NEGATIVE

## 2024-03-13 MED ORDER — PROMETHAZINE-DM 6.25-15 MG/5ML PO SYRP: 5.0000 mL | ORAL_SOLUTION | Freq: Four times a day (QID) | ORAL | 0 refills | Status: AC | PRN

## 2024-03-13 MED ORDER — ALBUTEROL SULFATE HFA 108 (90 BASE) MCG/ACT IN AERS: 1.0000 | INHALATION_SPRAY | Freq: Four times a day (QID) | RESPIRATORY_TRACT | 0 refills | Status: AC | PRN

## 2024-03-13 NOTE — ED Provider Notes (Signed)
 [facility]    CSN: [id number] Arrival date & time: 03/13/24  1358      History   Chief Complaint Chief Complaint  Patient presents with   Cough   Nasal Congestion   Fever    HPI Kerry Garrett is a 53 y.o. female.   Patient presents today with a 24 to 36-hour history of URI symptoms including cough, congestion, fever with Tmax of 101.5 F, chills, body aches, headache.  Denies any vomiting, diarrhea, chest pain.  She does report some shortness of breath and wheezing.  Denies any known sick contacts but she did recently attend a summer camp where she was exposed to many children.  She has had COVID in the past with last episode several years ago.  She has seasonal allergies and has been compliant with her allergy medication.  She does report a history of asthma as a child but has never been hospitalized for this and does not use an inhaler regularly.  Denies any recent antibiotics or steroids.  She has been using Pepto-Bismol for upset stomach, Mucinex, Excedrin with temporary improvement of symptoms.    Past Medical History:  Diagnosis Date   Allergy    Claritin controls and Flonase    Asthma    as a CHILD- NO PROBELM AS ADULT   COVID-19    09/27/20   FH: melanoma    gets annual exams- Dr.Isenstein   GERD (gastroesophageal reflux disease)    Diet controlled   Migraines     Patient Active Problem List   Diagnosis Date Noted   Menstrual irregularity 02/16/2024   Snoring 02/16/2024   BPPV (benign paroxysmal positional vertigo), bilateral 07/19/2023   Colon cancer screening    Polyp of cecum    Family history of neoplasm of uncertain behavior of uterus 08/16/2021   Migraine 08/16/2021   Hypersomnia 06/19/2021   Irritable bowel syndrome with both constipation and diarrhea 06/19/2021   Decreased libido 06/19/2021   Chronic headaches 06/19/2021   Vertigo 06/19/2021   Cough 02/15/2021   B12 deficiency 02/15/2021   Encounter for general adult medical  examination with abnormal findings 06/10/2020   FH: melanoma 06/10/2020   Allergic rhinitis 06/10/2020   Rectal spasm 06/10/2020   Immunizations incomplete 06/10/2020   History of 2019 novel coronavirus disease (COVID-19) 06/10/2020    Past Surgical History:  Procedure Laterality Date   CESAREAN SECTION     COLONOSCOPY WITH PROPOFOL  N/A 09/28/2021   Procedure: COLONOSCOPY WITH PROPOFOL ;  Surgeon: Unk Corinn Skiff, MD;  Location: [facility] ENDOSCOPY;  Service: Gastroenterology;  Laterality: N/A;   TONSILLECTOMY  1987   TUBAL LIGATION      OB History   No obstetric history on file.      Home Medications    Prior to Admission medications   Medication Sig Start Date End Date Taking? Authorizing Provider  albuterol (VENTOLIN HFA) 108 (90 Base) MCG/ACT inhaler Inhale 1-2 puffs into the lungs every 6 (six) hours as needed for wheezing or shortness of breath. 03/13/24  Yes Skyeler Scalese K, PA-C  b complex vitamins capsule Take 1 capsule by mouth daily.   Yes [provider]  MAGNESIUM GLYCINATE PO Take by mouth.   Yes [provider]  promethazine-dextromethorphan (PROMETHAZINE-DM) 6.25-15 MG/5ML syrup Take 5 mLs by mouth 4 (four) times daily as needed for cough. 03/13/24  Yes Lux Meaders K, PA-C  Cyanocobalamin (VITAMIN B12) 1000 MCG TABS Take 1,000 mcg by mouth daily.    [provider]  DHEA 10 MG CAPS Take 1 capsule by mouth daily.    [provider]  estradiol (VIVELLE-DOT) 0.025 MG/24HR Place 1 patch onto the skin 2 (two) times a week. 01/31/24   [provider]  fluticasone (FLONASE) 50 MCG/ACT nasal spray Place into both nostrils daily.    [provider]  levocetirizine (XYZAL ALLERGY 24HR) 5 MG tablet Take 5 mg by mouth daily.    [provider]  meclizine  (ANTIVERT ) 12.5 MG tablet Take 1 tablet (12.5 mg total) by mouth 3 (three) times daily as needed for dizziness. 07/19/23   Maribeth Camellia MATSU, MD  melatonin 3 MG TABS  tablet Take 3 mg by mouth at bedtime.    [provider]  progesterone (PROMETRIUM) 100 MG capsule Take 100 mg by mouth at bedtime. 02/02/24   [provider]  Vitamin D -Vitamin K (D3 + K2 PO) Take 1 capsule by mouth daily.    [provider]    Family History Family History  Problem Relation Age of Onset   Cancer Mother    Cancer Father    Asthma Son    Cancer Maternal Grandmother    Depression Maternal Grandmother    Cancer Maternal Grandfather     Social History Social History   Tobacco Use   Smoking status: Never   Smokeless tobacco: Never  Vaping Use   Vaping status: Never Used  Substance Use Topics   Alcohol use: Not Currently   Drug use: Never     Allergies   Erythromycin base and Lidocaine   Review of Systems Review of Systems  Constitutional:  Positive for activity change, chills, fatigue and fever. Negative for appetite change.  HENT:  Positive for congestion. Negative for sore throat.   Respiratory:  Positive for cough, shortness of breath and wheezing. Negative for chest tightness.   Cardiovascular:  Negative for chest pain.  Gastrointestinal:  Positive for nausea. Negative for abdominal pain, diarrhea and vomiting.  Musculoskeletal:  Positive for arthralgias and myalgias.  Neurological:  Positive for headaches. Negative for dizziness and light-headedness.     Physical Exam Triage Vital Signs ED Triage Vitals  Encounter Vitals Group     BP 03/13/24 1406 122/80     Girls Systolic BP Percentile --      Girls Diastolic BP Percentile --      Boys Systolic BP Percentile --      Boys Diastolic BP Percentile --      Pulse Rate 03/13/24 1406 84     Resp 03/13/24 1406 19     Temp 03/13/24 1406 99.5 F (37.5 C)     Temp src --      SpO2 03/13/24 1406 98 %     Weight --      Height --      Head Circumference --      Peak Flow --      Pain Score 03/13/24 1401 8     Pain Loc --      Pain Education --      Exclude from Growth  Chart --    No data found.  Updated Vital Signs BP 122/80   Pulse 84   Temp 99.5 F (37.5 C)   Resp 19   SpO2 98%   Visual Acuity Right Eye Distance:   Left Eye Distance:   Bilateral Distance:    Right Eye Near:   Left Eye Near:    Bilateral Near:     Physical Exam Vitals reviewed.  Constitutional:      General: She is awake. She is not in acute distress.    Appearance: Normal appearance. She is well-developed. She is not ill-appearing.     Comments: Very pleasant female appears stated age in no acute distress sitting comfortably in exam room  HENT:     Head: Normocephalic and atraumatic.     Right Ear: Tympanic membrane, ear canal and external ear normal. Tympanic membrane is not erythematous or bulging.     Left Ear: Tympanic membrane, ear canal and external ear normal. Tympanic membrane is not erythematous or bulging.     Nose:     Right Sinus: No maxillary sinus tenderness or frontal sinus tenderness.     Left Sinus: No maxillary sinus tenderness or frontal sinus tenderness.     Mouth/Throat:     Pharynx: Uvula midline. Postnasal drip present. No oropharyngeal exudate or posterior oropharyngeal erythema.   Cardiovascular:     Rate and Rhythm: Normal rate and regular rhythm.     Heart sounds: Normal heart sounds, S1 normal and S2 normal. No murmur heard. Pulmonary:     Effort: Pulmonary effort is normal.     Breath sounds: Normal breath sounds. No wheezing, rhonchi or rales.     Comments: Clear to auscultation bilaterally  Skin:    Findings: Lesion present.     Comments: Solitary erythematous papule measuring approximately 0.5 cm noted left lateral popliteal fossa.  No surrounding erythema or erythema migrans.  No evidence of excoriation.  No bleeding or drainage noted.   Psychiatric:        Behavior: Behavior is cooperative.      UC Treatments / Results  Labs (all labs ordered are listed, but only abnormal results are displayed) Labs Reviewed  POC  COVID19/FLU A&B COMBO - Normal    EKG   Radiology No results found.  Procedures Procedures (including critical care time)  Medications Ordered in UC Medications - No data to display  Initial Impression / Assessment and Plan / UC Course  I have reviewed the triage vital signs and the nursing notes.  Pertinent labs & imaging results that were available during my care of the patient were reviewed by me and considered in my medical decision making (see chart for details).     Patient is well-appearing, afebrile, nontoxic, nontachycardic.  Viral testing was obtained given short duration of symptoms that was negative for COVID and flu.  No evidence of acute infection on physical exam that would warrant initiation of antibiotics.  She does have an insect bite on her posterior left leg but this does not appear to be a tick bite given she was just recently out in the woods I will suspicion for tickborne illness.  We did discuss that if this changes or she develops any rash she should return immediately for reevaluation.  Chest x-ray was deferred as she had no adventitious lung sounds on exam and her oxygen saturation was 98%.  She was encouraged use over-the-counter medications for symptom management.  She was given Promethazine DM for cough and we discussed that this can be sedating and she is not to drive or drink alcohol with taking this medication.  She does have remote history of asthma does report occasional wheezing and so was provided a refill of albuterol inhaler with instruction to use it every 4-6 hours as needed.  If she is having to use this regularly or she has any worsening symptoms including fever, rash, nausea, vomiting, chest pain,  shortness of breath she needs to be seen emergently.  We discussed that if she is not feeling better within a week she should return here or see her primary care.  Strict return precautions given.  All questions were answered to patient  satisfaction.  Final Clinical Impressions(s) / UC Diagnoses   Final diagnoses:  Viral URI with cough  Fever, unspecified     Discharge Instructions      You tested negative for COVID and flu.  I suspect you have a different virus.  Take Promethazine DM for cough.  This will make you sleepy so do not drive or drink alcohol while taking it.  I have also called in an albuterol inhaler to be used every 4-6 hours as needed for shortness of breath and wheezing.  Make sure that you rest and drink plenty of fluids.  If you are not feeling better within a week please return for reevaluation.  If you have any worsening or changing symptoms including worsening cough, high fever not responding to medication, chest pain, shortness of breath, weakness you need to be seen immediately.     ED Prescriptions     Medication Sig Dispense Auth. Provider   promethazine-dextromethorphan (PROMETHAZINE-DM) 6.25-15 MG/5ML syrup Take 5 mLs by mouth 4 (four) times daily as needed for cough. 118 mL Yulisa Chirico K, PA-C   albuterol (VENTOLIN HFA) 108 (90 Base) MCG/ACT inhaler Inhale 1-2 puffs into the lungs every 6 (six) hours as needed for wheezing or shortness of breath. 18 g Josip Merolla K, PA-C      PDMP not reviewed this encounter.   Sherrell Rocky POUR, PA-C 03/13/24 1446

## 2024-03-13 NOTE — Discharge Instructions (Addendum)
 You tested negative for COVID and flu.  I suspect you have a different virus.  Take Promethazine DM for cough.  This will make you sleepy so do not drive or drink alcohol while taking it.  I have also called in an albuterol inhaler to be used every 4-6 hours as needed for shortness of breath and wheezing.  Make sure that you rest and drink plenty of fluids.  If you are not feeling better within a week please return for reevaluation.  If you have any worsening or changing symptoms including worsening cough, high fever not responding to medication, chest pain, shortness of breath, weakness you need to be seen immediately.

## 2024-03-13 NOTE — ED Triage Notes (Addendum)
 Patient to Urgent Care with complaints of cough/ SHOB/ nasal congestion/ body aches/ fevers (101.5)/ headaches/ nausea.  Reports symptoms started yesterday. Bug bite behind left knee.  Meds: pepto/ mucinex/ Excedrin/ tylenol. No otc today.

## 2024-03-17 ENCOUNTER — Telehealth: Admitting: Physician Assistant

## 2024-03-17 ENCOUNTER — Ambulatory Visit: Payer: Self-pay

## 2024-03-17 DIAGNOSIS — J208 Acute bronchitis due to other specified organisms: Secondary | ICD-10-CM

## 2024-03-17 DIAGNOSIS — B9689 Other specified bacterial agents as the cause of diseases classified elsewhere: Secondary | ICD-10-CM | POA: Diagnosis not present

## 2024-03-17 MED ORDER — BENZONATATE 100 MG PO CAPS: 100.0000 mg | ORAL_CAPSULE | Freq: Three times a day (TID) | ORAL | 0 refills | Status: AC | PRN

## 2024-03-17 MED ORDER — DOXYCYCLINE HYCLATE 100 MG PO TABS: 100.0000 mg | ORAL_TABLET | Freq: Two times a day (BID) | ORAL | 0 refills | Status: AC

## 2024-03-17 MED ORDER — PREDNISONE 20 MG PO TABS
40.0000 mg | ORAL_TABLET | Freq: Every day | ORAL | 0 refills | Status: AC
Start: 2024-03-17 — End: ?

## 2024-03-17 NOTE — Telephone Encounter (Signed)
 Spoke to pt she has had vv and  is on her way to pharmacy to pick up medication now

## 2024-03-17 NOTE — Telephone Encounter (Signed)
    FYI Only or Action Required?: FYI only for provider.  Patient was last seen in primary care on 02/11/2024 by Glendia Shad, MD. Called Nurse Triage reporting Cough, dry cough, LGF, BA. Symptoms began several days ago. Interventions attempted: Prescription medications: Inhaler - rx cough medication. Symptoms are: unchanged.  Triage Disposition: See Physician Within 24 Hours  Patient/caregiver understands and will follow disposition?: Yes - scheduled MyChart VV                  Copied from CRM [id number]. Topic: Clinical - Red Word Triage >> Mar 17, 2024 11:17 AM Robinson H wrote: [facility] that prompted transfer to Nurse Triage: Patient has upper respiratory infection not getting better, coughing she has to get control of her breathing. Reason for Disposition  [1] Continuous (nonstop) coughing interferes with work or school AND [2] no improvement using cough treatment per Care Advice  Answer Assessment - Initial Assessment Questions 1. ONSET: When did the cough begin?      Thursday 2. SEVERITY: How bad is the cough today?      moderate 3. SPUTUM: Describe the color of your sputum (none, dry cough; clear, white, yellow, green)     no 4. HEMOPTYSIS: Are you coughing up any blood? If so ask: How much? (flecks, streaks, tablespoons, etc.)     no 5. DIFFICULTY BREATHING: Are you having difficulty breathing? If Yes, ask: How bad is it? (e.g., mild, moderate, severe)    - MILD: No SOB at rest, mild SOB with walking, speaks normally in sentences, can lie down, no retractions, pulse < 100.    - MODERATE: SOB at rest, SOB with minimal exertion and prefers to sit, cannot lie down flat, speaks in phrases, mild retractions, audible wheezing, pulse 100-120.    - SEVERE: Very SOB at rest, speaks in single words, struggling to breathe, sitting hunched forward, retractions, pulse > 120      moderate 6. FEVER: Do you have a fever? If Yes, ask: What is your temperature, how  was it measured, and when did it start?     101 on Friday - now 99.7 7. CARDIAC HISTORY: Do you have any history of heart disease? (e.g., heart attack, congestive heart failure)      no 8. LUNG HISTORY: Do you have any history of lung disease?  (e.g., pulmonary embolus, asthma, emphysema)     bronchitis 9. PE RISK FACTORS: Do you have a history of blood clots? (or: recent major surgery, recent prolonged travel, bedridden)      10. OTHER SYMPTOMS: Do you have any other symptoms? (e.g., runny nose, wheezing, chest pain)       Chest hurts from cough - feels heavy. Sinus congestion, BA  Protocols used: Cough - Acute Non-Productive-A-AH

## 2024-03-17 NOTE — Progress Notes (Signed)
 Virtual Visit Consent   Kerry Garrett, you are scheduled for a virtual visit with a [facility] provider today. Just as with appointments in the office, your consent must be obtained to participate. Your consent will be active for this visit and any virtual visit you may have with one of our providers in the next 365 days. If you have a MyChart account, a copy of this consent can be sent to you electronically.  As this is a virtual visit, video technology does not allow for your provider to perform a traditional examination. This may limit your provider's ability to fully assess your condition. If your provider identifies any concerns that need to be evaluated in person or the need to arrange testing (such as labs, EKG, etc.), we will make arrangements to do so. Although advances in technology are sophisticated, we cannot ensure that it will always work on either your end or our end. If the connection with a video visit is poor, the visit may have to be switched to a telephone visit. With either a video or telephone visit, we are not always able to ensure that we have a secure connection.  By engaging in this virtual visit, you consent to the provision of healthcare and authorize for your insurance to be billed (if applicable) for the services provided during this visit. Depending on your insurance coverage, you may receive a charge related to this service.  I need to obtain your verbal consent now. Are you willing to proceed with your visit today? Kerry Garrett has provided verbal consent on 03/17/2024 for a virtual visit (video or telephone). Kerry Garrett, [place]  Date: 03/17/2024 12:10 PM   Virtual Visit via Video Note   I, Kerry Garrett, connected with  Kerry Garrett  ([id number], 03-Jun-1971) on 03/17/24 at 11:45 AM EDT by a video-enabled telemedicine application and verified that I am speaking with the correct person using two identifiers.  Location: Patient: Virtual Visit  Location Patient: Home Provider: Virtual Visit Location Provider: Home Office   I discussed the limitations of evaluation and management by telemedicine and the availability of in person appointments. The patient expressed understanding and agreed to proceed.    History of Present Illness: Kerry Garrett is a 53 y.o. who identifies as a female who was assigned female at birth, and is being seen today for continued URI symptoms with progression since her recent UC visit. Notes increased chest congestion and cough that is now associated with tightness and chest wall-tenderness. Mucous is thick. Still with low-grade fever. Denies SOB outside of a bad coughing spell.   HPI: HPI  Problems:  Patient Active Problem List   Diagnosis Date Noted   Menstrual irregularity 02/16/2024   Snoring 02/16/2024   BPPV (benign paroxysmal positional vertigo), bilateral 07/19/2023   Colon cancer screening    Polyp of cecum    Family history of neoplasm of uncertain behavior of uterus 08/16/2021   Migraine 08/16/2021   Hypersomnia 06/19/2021   Irritable bowel syndrome with both constipation and diarrhea 06/19/2021   Decreased libido 06/19/2021   Chronic headaches 06/19/2021   Vertigo 06/19/2021   Cough 02/15/2021   B12 deficiency 02/15/2021   Encounter for general adult medical examination with abnormal findings 06/10/2020   FH: melanoma 06/10/2020   Allergic rhinitis 06/10/2020   Rectal spasm 06/10/2020   Immunizations incomplete 06/10/2020   History of 2019 novel coronavirus disease (COVID-19) 06/10/2020    Allergies:  Allergies  Allergen Reactions  Clindamycin/Lincomycin Nausea And Vomiting    Diarrhea   Erythromycin Base    Lidocaine Rash   Medications:  Current Outpatient Medications:    benzonatate (TESSALON) 100 MG capsule, Take 1 capsule (100 mg total) by mouth 3 (three) times daily as needed for cough., Disp: 30 capsule, Rfl: 0   doxycycline (VIBRA-TABS) 100 MG tablet, Take 1 tablet  (100 mg total) by mouth 2 (two) times daily., Disp: 14 tablet, Rfl: 0   predniSONE  (DELTASONE ) 20 MG tablet, Take 2 tablets (40 mg total) by mouth daily with breakfast., Disp: 10 tablet, Rfl: 0   albuterol (VENTOLIN HFA) 108 (90 Base) MCG/ACT inhaler, Inhale 1-2 puffs into the lungs every 6 (six) hours as needed for wheezing or shortness of breath., Disp: 18 g, Rfl: 0   b complex vitamins capsule, Take 1 capsule by mouth daily., Disp: , Rfl:    Cyanocobalamin (VITAMIN B12) 1000 MCG TABS, Take 1,000 mcg by mouth daily., Disp: , Rfl:    DHEA 10 MG CAPS, Take 1 capsule by mouth daily., Disp: , Rfl:    estradiol (VIVELLE-DOT) 0.025 MG/24HR, Place 1 patch onto the skin 2 (two) times a week., Disp: , Rfl:    levocetirizine (XYZAL ALLERGY 24HR) 5 MG tablet, Take 5 mg by mouth daily., Disp: , Rfl:    MAGNESIUM GLYCINATE PO, Take by mouth., Disp: , Rfl:    melatonin 3 MG TABS tablet, Take 3 mg by mouth at bedtime., Disp: , Rfl:    progesterone (PROMETRIUM) 100 MG capsule, Take 100 mg by mouth at bedtime., Disp: , Rfl:    promethazine-dextromethorphan (PROMETHAZINE-DM) 6.25-15 MG/5ML syrup, Take 5 mLs by mouth 4 (four) times daily as needed for cough., Disp: 118 mL, Rfl: 0   Vitamin D -Vitamin K (D3 + K2 PO), Take 1 capsule by mouth daily., Disp: , Rfl:   Observations/Objective: Patient is well-developed, well-nourished in no acute distress.  Resting comfortably at home.  Head is normocephalic, atraumatic.  No labored breathing. Speech is clear and coherent with logical content.  Patient is alert and oriented at baseline.   Assessment and Plan: 1. Acute bacterial bronchitis (Primary) - benzonatate (TESSALON) 100 MG capsule; Take 1 capsule (100 mg total) by mouth 3 (three) times daily as needed for cough.  Dispense: 30 capsule; Refill: 0 - predniSONE  (DELTASONE ) 20 MG tablet; Take 2 tablets (40 mg total) by mouth daily with breakfast.  Dispense: 10 tablet; Refill: 0 - doxycycline (VIBRA-TABS) 100 MG  tablet; Take 1 tablet (100 mg total) by mouth 2 (two) times daily.  Dispense: 14 tablet; Refill: 0  Supportive measures and OTC medications reviewed. Will add-on Doxycycline and burst of prednisone . Tessalon for daytime cough as the promethazine-DM given at [facility] makes her too sleepy. In person follow-up for any non-resolving, new or worsening symptoms despite treatment.   Follow Up Instructions: I discussed the assessment and treatment plan with the patient. The patient was provided an opportunity to ask questions and all were answered. The patient agreed with the plan and demonstrated an understanding of the instructions.  A copy of instructions were sent to the patient via MyChart unless otherwise noted below.   The patient was advised to call back or seek an in-person evaluation if the symptoms worsen or if the condition fails to improve as anticipated.    Kerry Velma Lunger, PA-C

## 2024-03-17 NOTE — Patient Instructions (Addendum)
 [name], thank you for joining [name], PA-C for today's virtual visit.  While this provider is not your primary care provider (PCP), if your PCP is located in our provider database this encounter information will be shared with them immediately following your visit.   A [facility] MyChart account gives you access to today's visit and all your visits, tests, and labs performed at [facility]  click here if you don't have a [facility] MyChart account or go to [email or web address]  Consent: (Patient) Kerry Garrett provided verbal consent for this virtual visit at the beginning of the encounter.  Current Medications:  Current Outpatient Medications:    benzonatate (TESSALON) 100 MG capsule, Take 1 capsule (100 mg total) by mouth 3 (three) times daily as needed for cough., Disp: 30 capsule, Rfl: 0   doxycycline (VIBRA-TABS) 100 MG tablet, Take 1 tablet (100 mg total) by mouth 2 (two) times daily., Disp: 14 tablet, Rfl: 0   predniSONE  (DELTASONE ) 20 MG tablet, Take 2 tablets (40 mg total) by mouth daily with breakfast., Disp: 10 tablet, Rfl: 0   albuterol (VENTOLIN HFA) 108 (90 Base) MCG/ACT inhaler, Inhale 1-2 puffs into the lungs every 6 (six) hours as needed for wheezing or shortness of breath., Disp: 18 g, Rfl: 0   b complex vitamins capsule, Take 1 capsule by mouth daily., Disp: , Rfl:    Cyanocobalamin (VITAMIN B12) 1000 MCG TABS, Take 1,000 mcg by mouth daily., Disp: , Rfl:    DHEA 10 MG CAPS, Take 1 capsule by mouth daily., Disp: , Rfl:    estradiol (VIVELLE-DOT) 0.025 MG/24HR, Place 1 patch onto the skin 2 (two) times a week., Disp: , Rfl:    levocetirizine (XYZAL ALLERGY 24HR) 5 MG tablet, Take 5 mg by mouth daily., Disp: , Rfl:    MAGNESIUM GLYCINATE PO, Take by mouth., Disp: , Rfl:    melatonin 3 MG TABS tablet, Take 3 mg by mouth at bedtime., Disp: , Rfl:    progesterone (PROMETRIUM) 100 MG capsule, Take 100 mg by mouth at bedtime., Disp: ,  Rfl:    promethazine-dextromethorphan (PROMETHAZINE-DM) 6.25-15 MG/5ML syrup, Take 5 mLs by mouth 4 (four) times daily as needed for cough., Disp: 118 mL, Rfl: 0   Vitamin D -Vitamin K (D3 + K2 PO), Take 1 capsule by mouth daily., Disp: , Rfl:    Medications ordered in this encounter:  Meds ordered this encounter  Medications   benzonatate (TESSALON) 100 MG capsule    Sig: Take 1 capsule (100 mg total) by mouth 3 (three) times daily as needed for cough.    Dispense:  30 capsule    Refill:  0    Supervising Provider:   LAMPTEY, PHILIP O [[id number]]   predniSONE  (DELTASONE ) 20 MG tablet    Sig: Take 2 tablets (40 mg total) by mouth daily with breakfast.    Dispense:  10 tablet    Refill:  0    Supervising Provider:   LAMPTEY, PHILIP O [[id number]]   doxycycline (VIBRA-TABS) 100 MG tablet    Sig: Take 1 tablet (100 mg total) by mouth 2 (two) times daily.    Dispense:  14 tablet    Refill:  0    Supervising Provider:   BLAISE ALEENE KIDD [[id number]]     *If you need refills on other medications prior to your next appointment, please contact your pharmacy*  Follow-Up: Call back or seek an in-person evaluation if the symptoms worsen or if the condition  fails to improve as anticipated.  [facility] [phone]  Other Instructions Take antibiotic (Doxycycline) as directed.  Increase fluids.  Get plenty of rest. Use Mucinex for congestion. Take the Tessalon as directed . Take a daily probiotic (I recommend Align or Culturelle, but even Activia Yogurt may be beneficial).  A humidifier placed in the bedroom may offer some relief for a dry, scratchy throat of nasal irritation.  Read information below on acute bronchitis. Please call or return to clinic if symptoms are not improving.  Acute Bronchitis Bronchitis is when the airways that extend from the windpipe into the lungs get red, puffy, and painful (inflamed). Bronchitis often causes thick spit (mucus) to develop. This leads to a  cough. A cough is the most common symptom of bronchitis. In acute bronchitis, the condition usually begins suddenly and goes away over time (usually in 2 weeks). Smoking, allergies, and asthma can make bronchitis worse. Repeated episodes of bronchitis may cause more lung problems.  HOME CARE Rest. Drink enough fluids to keep your pee (urine) clear or pale yellow (unless you need to limit fluids as told by your doctor). Only take over-the-counter or prescription medicines as told by your doctor. Avoid smoking and secondhand smoke. These can make bronchitis worse. If you are a smoker, think about using nicotine gum or skin patches. Quitting smoking will help your lungs heal faster. Reduce the chance of getting bronchitis again by: Washing your hands often. Avoiding people with cold symptoms. Trying not to touch your hands to your mouth, nose, or eyes. Follow up with your doctor as told.  GET HELP IF: Your symptoms do not improve after 1 week of treatment. Symptoms include: Cough. Fever. Coughing up thick spit. Body aches. Chest congestion. Chills. Shortness of breath. Sore throat.  GET HELP RIGHT AWAY IF:  You have an increased fever. You have chills. You have severe shortness of breath. You have bloody thick spit (sputum). You throw up (vomit) often. You lose too much body fluid (dehydration). You have a severe headache. You faint.  MAKE SURE YOU:  Understand these instructions. Will watch your condition. Will get help right away if you are not doing well or get worse. Document Released: 02/20/2008 Document Revised: 05/06/2013 Document Reviewed: 02/24/2013 [facility] Patient Information 2015 [place], [place]. This information is not intended to replace advice given to you by your health care provider. Make sure you discuss any questions you have with your health care provider.    If you have been instructed to have an in-person evaluation today at a local Urgent Care facility,  please use the link below. It will take you to a list of all of our available [facility], including address, phone number and hours of operation. Please do not delay care.  [facility]  If you or a family member do not have a primary care provider, use the link below to schedule a visit and establish care. When you choose a [facility] primary care physician or advanced practice provider, you gain a long-term partner in health. Find a Primary Care Provider  Learn more about [facility]'s in-office and virtual care options: [facility] Get Care Now

## 2024-05-06 DIAGNOSIS — M9903 Segmental and somatic dysfunction of lumbar region: Secondary | ICD-10-CM | POA: Diagnosis not present

## 2024-05-06 DIAGNOSIS — M9901 Segmental and somatic dysfunction of cervical region: Secondary | ICD-10-CM | POA: Diagnosis not present

## 2024-05-06 DIAGNOSIS — M9904 Segmental and somatic dysfunction of sacral region: Secondary | ICD-10-CM | POA: Diagnosis not present

## 2024-05-06 DIAGNOSIS — M9902 Segmental and somatic dysfunction of thoracic region: Secondary | ICD-10-CM | POA: Diagnosis not present

## 2024-05-07 DIAGNOSIS — M9903 Segmental and somatic dysfunction of lumbar region: Secondary | ICD-10-CM | POA: Diagnosis not present

## 2024-05-07 DIAGNOSIS — M9902 Segmental and somatic dysfunction of thoracic region: Secondary | ICD-10-CM | POA: Diagnosis not present

## 2024-05-07 DIAGNOSIS — M9904 Segmental and somatic dysfunction of sacral region: Secondary | ICD-10-CM | POA: Diagnosis not present

## 2024-05-07 DIAGNOSIS — M9901 Segmental and somatic dysfunction of cervical region: Secondary | ICD-10-CM | POA: Diagnosis not present

## 2024-05-11 DIAGNOSIS — M9904 Segmental and somatic dysfunction of sacral region: Secondary | ICD-10-CM | POA: Diagnosis not present

## 2024-05-11 DIAGNOSIS — M9903 Segmental and somatic dysfunction of lumbar region: Secondary | ICD-10-CM | POA: Diagnosis not present

## 2024-05-11 DIAGNOSIS — M9902 Segmental and somatic dysfunction of thoracic region: Secondary | ICD-10-CM | POA: Diagnosis not present

## 2024-05-11 DIAGNOSIS — M9901 Segmental and somatic dysfunction of cervical region: Secondary | ICD-10-CM | POA: Diagnosis not present

## 2024-05-13 DIAGNOSIS — M9901 Segmental and somatic dysfunction of cervical region: Secondary | ICD-10-CM | POA: Diagnosis not present

## 2024-05-13 DIAGNOSIS — M9904 Segmental and somatic dysfunction of sacral region: Secondary | ICD-10-CM | POA: Diagnosis not present

## 2024-05-13 DIAGNOSIS — M9903 Segmental and somatic dysfunction of lumbar region: Secondary | ICD-10-CM | POA: Diagnosis not present

## 2024-05-13 DIAGNOSIS — M9902 Segmental and somatic dysfunction of thoracic region: Secondary | ICD-10-CM | POA: Diagnosis not present

## 2024-05-14 DIAGNOSIS — M9903 Segmental and somatic dysfunction of lumbar region: Secondary | ICD-10-CM | POA: Diagnosis not present

## 2024-05-14 DIAGNOSIS — M9904 Segmental and somatic dysfunction of sacral region: Secondary | ICD-10-CM | POA: Diagnosis not present

## 2024-05-14 DIAGNOSIS — M9901 Segmental and somatic dysfunction of cervical region: Secondary | ICD-10-CM | POA: Diagnosis not present

## 2024-05-14 DIAGNOSIS — M9902 Segmental and somatic dysfunction of thoracic region: Secondary | ICD-10-CM | POA: Diagnosis not present

## 2024-05-19 DIAGNOSIS — M9901 Segmental and somatic dysfunction of cervical region: Secondary | ICD-10-CM | POA: Diagnosis not present

## 2024-05-19 DIAGNOSIS — M9902 Segmental and somatic dysfunction of thoracic region: Secondary | ICD-10-CM | POA: Diagnosis not present

## 2024-05-19 DIAGNOSIS — M9903 Segmental and somatic dysfunction of lumbar region: Secondary | ICD-10-CM | POA: Diagnosis not present

## 2024-05-19 DIAGNOSIS — M9904 Segmental and somatic dysfunction of sacral region: Secondary | ICD-10-CM | POA: Diagnosis not present

## 2024-05-20 DIAGNOSIS — M9902 Segmental and somatic dysfunction of thoracic region: Secondary | ICD-10-CM | POA: Diagnosis not present

## 2024-05-20 DIAGNOSIS — M9903 Segmental and somatic dysfunction of lumbar region: Secondary | ICD-10-CM | POA: Diagnosis not present

## 2024-05-20 DIAGNOSIS — M9904 Segmental and somatic dysfunction of sacral region: Secondary | ICD-10-CM | POA: Diagnosis not present

## 2024-05-20 DIAGNOSIS — M9901 Segmental and somatic dysfunction of cervical region: Secondary | ICD-10-CM | POA: Diagnosis not present

## 2024-05-21 DIAGNOSIS — M9902 Segmental and somatic dysfunction of thoracic region: Secondary | ICD-10-CM | POA: Diagnosis not present

## 2024-05-21 DIAGNOSIS — M9904 Segmental and somatic dysfunction of sacral region: Secondary | ICD-10-CM | POA: Diagnosis not present

## 2024-05-21 DIAGNOSIS — M9903 Segmental and somatic dysfunction of lumbar region: Secondary | ICD-10-CM | POA: Diagnosis not present

## 2024-05-21 DIAGNOSIS — M9901 Segmental and somatic dysfunction of cervical region: Secondary | ICD-10-CM | POA: Diagnosis not present

## 2024-05-25 DIAGNOSIS — M9901 Segmental and somatic dysfunction of cervical region: Secondary | ICD-10-CM | POA: Diagnosis not present

## 2024-05-25 DIAGNOSIS — M9903 Segmental and somatic dysfunction of lumbar region: Secondary | ICD-10-CM | POA: Diagnosis not present

## 2024-05-25 DIAGNOSIS — M9904 Segmental and somatic dysfunction of sacral region: Secondary | ICD-10-CM | POA: Diagnosis not present

## 2024-05-25 DIAGNOSIS — M9902 Segmental and somatic dysfunction of thoracic region: Secondary | ICD-10-CM | POA: Diagnosis not present

## 2024-05-27 DIAGNOSIS — M9903 Segmental and somatic dysfunction of lumbar region: Secondary | ICD-10-CM | POA: Diagnosis not present

## 2024-05-27 DIAGNOSIS — M9902 Segmental and somatic dysfunction of thoracic region: Secondary | ICD-10-CM | POA: Diagnosis not present

## 2024-05-27 DIAGNOSIS — M9904 Segmental and somatic dysfunction of sacral region: Secondary | ICD-10-CM | POA: Diagnosis not present

## 2024-05-27 DIAGNOSIS — M9901 Segmental and somatic dysfunction of cervical region: Secondary | ICD-10-CM | POA: Diagnosis not present

## 2024-05-28 DIAGNOSIS — M9904 Segmental and somatic dysfunction of sacral region: Secondary | ICD-10-CM | POA: Diagnosis not present

## 2024-05-28 DIAGNOSIS — M9902 Segmental and somatic dysfunction of thoracic region: Secondary | ICD-10-CM | POA: Diagnosis not present

## 2024-05-28 DIAGNOSIS — M9901 Segmental and somatic dysfunction of cervical region: Secondary | ICD-10-CM | POA: Diagnosis not present

## 2024-05-28 DIAGNOSIS — M9903 Segmental and somatic dysfunction of lumbar region: Secondary | ICD-10-CM | POA: Diagnosis not present

## 2024-06-03 DIAGNOSIS — M9902 Segmental and somatic dysfunction of thoracic region: Secondary | ICD-10-CM | POA: Diagnosis not present

## 2024-06-03 DIAGNOSIS — M9904 Segmental and somatic dysfunction of sacral region: Secondary | ICD-10-CM | POA: Diagnosis not present

## 2024-06-03 DIAGNOSIS — M9903 Segmental and somatic dysfunction of lumbar region: Secondary | ICD-10-CM | POA: Diagnosis not present

## 2024-06-03 DIAGNOSIS — M9901 Segmental and somatic dysfunction of cervical region: Secondary | ICD-10-CM | POA: Diagnosis not present

## 2024-06-04 DIAGNOSIS — M9902 Segmental and somatic dysfunction of thoracic region: Secondary | ICD-10-CM | POA: Diagnosis not present

## 2024-06-04 DIAGNOSIS — M9903 Segmental and somatic dysfunction of lumbar region: Secondary | ICD-10-CM | POA: Diagnosis not present

## 2024-06-04 DIAGNOSIS — M9901 Segmental and somatic dysfunction of cervical region: Secondary | ICD-10-CM | POA: Diagnosis not present

## 2024-06-04 DIAGNOSIS — M9904 Segmental and somatic dysfunction of sacral region: Secondary | ICD-10-CM | POA: Diagnosis not present

## 2024-06-09 DIAGNOSIS — M7581 Other shoulder lesions, right shoulder: Secondary | ICD-10-CM | POA: Diagnosis not present

## 2024-06-10 DIAGNOSIS — M9903 Segmental and somatic dysfunction of lumbar region: Secondary | ICD-10-CM | POA: Diagnosis not present

## 2024-06-10 DIAGNOSIS — M9904 Segmental and somatic dysfunction of sacral region: Secondary | ICD-10-CM | POA: Diagnosis not present

## 2024-06-10 DIAGNOSIS — M9902 Segmental and somatic dysfunction of thoracic region: Secondary | ICD-10-CM | POA: Diagnosis not present

## 2024-06-10 DIAGNOSIS — M9901 Segmental and somatic dysfunction of cervical region: Secondary | ICD-10-CM | POA: Diagnosis not present

## 2024-06-11 DIAGNOSIS — M9901 Segmental and somatic dysfunction of cervical region: Secondary | ICD-10-CM | POA: Diagnosis not present

## 2024-06-11 DIAGNOSIS — M9904 Segmental and somatic dysfunction of sacral region: Secondary | ICD-10-CM | POA: Diagnosis not present

## 2024-06-11 DIAGNOSIS — M9903 Segmental and somatic dysfunction of lumbar region: Secondary | ICD-10-CM | POA: Diagnosis not present

## 2024-06-11 DIAGNOSIS — M9902 Segmental and somatic dysfunction of thoracic region: Secondary | ICD-10-CM | POA: Diagnosis not present

## 2024-06-15 DIAGNOSIS — M9903 Segmental and somatic dysfunction of lumbar region: Secondary | ICD-10-CM | POA: Diagnosis not present

## 2024-06-15 DIAGNOSIS — M9902 Segmental and somatic dysfunction of thoracic region: Secondary | ICD-10-CM | POA: Diagnosis not present

## 2024-06-15 DIAGNOSIS — M9904 Segmental and somatic dysfunction of sacral region: Secondary | ICD-10-CM | POA: Diagnosis not present

## 2024-06-15 DIAGNOSIS — M9901 Segmental and somatic dysfunction of cervical region: Secondary | ICD-10-CM | POA: Diagnosis not present

## 2024-06-17 DIAGNOSIS — M9903 Segmental and somatic dysfunction of lumbar region: Secondary | ICD-10-CM | POA: Diagnosis not present

## 2024-06-17 DIAGNOSIS — M9901 Segmental and somatic dysfunction of cervical region: Secondary | ICD-10-CM | POA: Diagnosis not present

## 2024-06-17 DIAGNOSIS — M9902 Segmental and somatic dysfunction of thoracic region: Secondary | ICD-10-CM | POA: Diagnosis not present

## 2024-06-17 DIAGNOSIS — M9904 Segmental and somatic dysfunction of sacral region: Secondary | ICD-10-CM | POA: Diagnosis not present

## 2024-06-22 DIAGNOSIS — M542 Cervicalgia: Secondary | ICD-10-CM | POA: Diagnosis not present

## 2024-06-22 DIAGNOSIS — M9902 Segmental and somatic dysfunction of thoracic region: Secondary | ICD-10-CM | POA: Diagnosis not present

## 2024-06-22 DIAGNOSIS — M9903 Segmental and somatic dysfunction of lumbar region: Secondary | ICD-10-CM | POA: Diagnosis not present

## 2024-06-22 DIAGNOSIS — M25511 Pain in right shoulder: Secondary | ICD-10-CM | POA: Diagnosis not present

## 2024-06-22 DIAGNOSIS — M9904 Segmental and somatic dysfunction of sacral region: Secondary | ICD-10-CM | POA: Diagnosis not present

## 2024-06-22 DIAGNOSIS — M9901 Segmental and somatic dysfunction of cervical region: Secondary | ICD-10-CM | POA: Diagnosis not present

## 2024-06-24 DIAGNOSIS — M9904 Segmental and somatic dysfunction of sacral region: Secondary | ICD-10-CM | POA: Diagnosis not present

## 2024-06-24 DIAGNOSIS — M9903 Segmental and somatic dysfunction of lumbar region: Secondary | ICD-10-CM | POA: Diagnosis not present

## 2024-06-24 DIAGNOSIS — M542 Cervicalgia: Secondary | ICD-10-CM | POA: Diagnosis not present

## 2024-06-24 DIAGNOSIS — M9901 Segmental and somatic dysfunction of cervical region: Secondary | ICD-10-CM | POA: Diagnosis not present

## 2024-06-24 DIAGNOSIS — M9902 Segmental and somatic dysfunction of thoracic region: Secondary | ICD-10-CM | POA: Diagnosis not present

## 2024-06-24 DIAGNOSIS — M25511 Pain in right shoulder: Secondary | ICD-10-CM | POA: Diagnosis not present

## 2024-06-29 DIAGNOSIS — M542 Cervicalgia: Secondary | ICD-10-CM | POA: Diagnosis not present

## 2024-06-29 DIAGNOSIS — M9901 Segmental and somatic dysfunction of cervical region: Secondary | ICD-10-CM | POA: Diagnosis not present

## 2024-06-29 DIAGNOSIS — M9903 Segmental and somatic dysfunction of lumbar region: Secondary | ICD-10-CM | POA: Diagnosis not present

## 2024-06-29 DIAGNOSIS — M9904 Segmental and somatic dysfunction of sacral region: Secondary | ICD-10-CM | POA: Diagnosis not present

## 2024-06-29 DIAGNOSIS — M9902 Segmental and somatic dysfunction of thoracic region: Secondary | ICD-10-CM | POA: Diagnosis not present

## 2024-06-29 DIAGNOSIS — M25511 Pain in right shoulder: Secondary | ICD-10-CM | POA: Diagnosis not present

## 2024-06-30 ENCOUNTER — Other Ambulatory Visit: Payer: Self-pay | Admitting: Family Medicine

## 2024-06-30 DIAGNOSIS — N939 Abnormal uterine and vaginal bleeding, unspecified: Secondary | ICD-10-CM

## 2024-07-01 DIAGNOSIS — M9904 Segmental and somatic dysfunction of sacral region: Secondary | ICD-10-CM | POA: Diagnosis not present

## 2024-07-01 DIAGNOSIS — M9901 Segmental and somatic dysfunction of cervical region: Secondary | ICD-10-CM | POA: Diagnosis not present

## 2024-07-01 DIAGNOSIS — M25511 Pain in right shoulder: Secondary | ICD-10-CM | POA: Diagnosis not present

## 2024-07-01 DIAGNOSIS — M9903 Segmental and somatic dysfunction of lumbar region: Secondary | ICD-10-CM | POA: Diagnosis not present

## 2024-07-01 DIAGNOSIS — M9902 Segmental and somatic dysfunction of thoracic region: Secondary | ICD-10-CM | POA: Diagnosis not present

## 2024-07-01 DIAGNOSIS — M542 Cervicalgia: Secondary | ICD-10-CM | POA: Diagnosis not present

## 2024-07-07 ENCOUNTER — Ambulatory Visit
Admission: RE | Admit: 2024-07-07 | Discharge: 2024-07-07 | Disposition: A | Discharge status: Home / Self Care | Source: Ambulatory Visit | Attending: Family Medicine | Admitting: Family Medicine

## 2024-07-07 DIAGNOSIS — N939 Abnormal uterine and vaginal bleeding, unspecified: Secondary | ICD-10-CM | POA: Diagnosis not present

## 2024-07-08 ENCOUNTER — Ambulatory Visit: Admitting: Podiatry

## 2024-07-08 ENCOUNTER — Ambulatory Visit (INDEPENDENT_AMBULATORY_CARE_PROVIDER_SITE_OTHER)

## 2024-07-08 DIAGNOSIS — M722 Plantar fascial fibromatosis: Secondary | ICD-10-CM | POA: Diagnosis not present

## 2024-07-08 MED ORDER — CELECOXIB 200 MG PO CAPS: 200.0000 mg | ORAL_CAPSULE | Freq: Two times a day (BID) | ORAL | 3 refills | Status: AC

## 2024-07-08 MED ORDER — METHYLPREDNISOLONE 4 MG PO TBPK: ORAL_TABLET | ORAL | 0 refills | Status: AC

## 2024-07-08 NOTE — Progress Notes (Signed)
 Subjective:  Patient ID: [name], female    DOB: [birth year],  MRN: [id number] HPI Chief Complaint  Patient presents with   Foot Pain    Left foot - Intense pain in center of the heel on the bottom of the foot. Has been on going issue but now hurts majority of the day and is causing issues with daily life.    53 y.o. female presents with the above complaint.   ROS: Denies fever chills nausea vomiting muscle aches pains calf pain back pain chest pain shortness of breath.  She has pain on palpation medial calcaneal tubercle of the left heel.  Past Medical History:  Diagnosis Date   Allergy    Claritin controls and Flonase    Asthma    as a CHILD- NO PROBELM AS ADULT   COVID-19    09/27/20   FH: melanoma    gets annual exams- Dr.Isenstein   GERD (gastroesophageal reflux disease)    Diet controlled   Migraines    Past Surgical History:  Procedure Laterality Date   CESAREAN SECTION     COLONOSCOPY WITH PROPOFOL  N/A 09/28/2021   Procedure: COLONOSCOPY WITH PROPOFOL ;  Surgeon: Unk Corinn Skiff, MD;  Location: [facility] ENDOSCOPY;  Service: Gastroenterology;  Laterality: N/A;   TONSILLECTOMY  1987   TUBAL LIGATION      Current Outpatient Medications:    celecoxib (CELEBREX) 200 MG capsule, Take 1 capsule (200 mg total) by mouth 2 (two) times daily., Disp: 60 capsule, Rfl: 3   methylPREDNISolone (MEDROL DOSEPAK) 4 MG TBPK tablet, 6 day dose pack - take as directed, Disp: 21 tablet, Rfl: 0   albuterol  (VENTOLIN  HFA) 108 (90 Base) MCG/ACT inhaler, Inhale 1-2 puffs into the lungs every 6 (six) hours as needed for wheezing or shortness of breath., Disp: 18 g, Rfl: 0   b complex vitamins capsule, Take 1 capsule by mouth daily., Disp: , Rfl:    benzonatate  (TESSALON ) 100 MG capsule, Take 1 capsule (100 mg total) by mouth 3 (three) times daily as needed for cough., Disp: 30 capsule, Rfl: 0   Cyanocobalamin (VITAMIN B12) 1000 MCG TABS, Take 1,000 mcg by mouth daily., Disp: , Rfl:     DHEA 10 MG CAPS, Take 1 capsule by mouth daily., Disp: , Rfl:    doxycycline  (VIBRA -TABS) 100 MG tablet, Take 1 tablet (100 mg total) by mouth 2 (two) times daily., Disp: 14 tablet, Rfl: 0   estradiol (VIVELLE-DOT) 0.025 MG/24HR, Place 1 patch onto the skin 2 (two) times a week., Disp: , Rfl:    levocetirizine (XYZAL ALLERGY 24HR) 5 MG tablet, Take 5 mg by mouth daily., Disp: , Rfl:    MAGNESIUM GLYCINATE PO, Take by mouth., Disp: , Rfl:    melatonin 3 MG TABS tablet, Take 3 mg by mouth at bedtime., Disp: , Rfl:    predniSONE  (DELTASONE ) 20 MG tablet, Take 2 tablets (40 mg total) by mouth daily with breakfast., Disp: 10 tablet, Rfl: 0   progesterone (PROMETRIUM) 100 MG capsule, Take 100 mg by mouth at bedtime., Disp: , Rfl:    promethazine -dextromethorphan (PROMETHAZINE -DM) 6.25-15 MG/5ML syrup, Take 5 mLs by mouth 4 (four) times daily as needed for cough., Disp: 118 mL, Rfl: 0   Vitamin D -Vitamin K (D3 + K2 PO), Take 1 capsule by mouth daily., Disp: , Rfl:   Allergies  Allergen Reactions   Clindamycin/Lincomycin Nausea And Vomiting    Diarrhea   Erythromycin Base    Lidocaine Rash   Review of  Systems Objective:  There were no vitals filed for this visit.  General: Well developed, nourished, in no acute distress, alert and oriented x3   Dermatological: Skin is warm, dry and supple bilateral. Nails x 10 are well maintained; remaining integument appears unremarkable at this time. There are no open sores, no preulcerative lesions, no rash or signs of infection present.  Vascular: Dorsalis Pedis artery and Posterior Tibial artery pedal pulses are 2/4 bilateral with immedate capillary fill time. Pedal hair growth present. No varicosities and no lower extremity edema present bilateral.   Neruologic: Grossly intact via light touch bilateral. Vibratory intact via tuning fork bilateral. Protective threshold with Semmes Wienstein monofilament intact to all pedal sites bilateral. Patellar and  Achilles deep tendon reflexes 2+ bilateral. No Babinski or clonus noted bilateral.   Musculoskeletal: No gross boney pedal deformities bilateral. No pain, crepitus, or limitation noted with foot and ankle range of motion bilateral. Muscular strength 5/5 in all groups tested bilateral.  Gait: Unassisted, Nonantalgic.    Radiographs:  Radiographs taken today demonstrate osseously mature individual some spurring is noted posterior and plantar heel.  No acute findings identified.  Soft tissue increase in density at the plantar fascial calcaneal insertion site left heel.  Assessment & Plan:   Assessment: Plantar fasciitis left.  Plan: Offered her an injection she declined.  Started her on methylprednisolone to be followed by Celebrex.  Celebrex 200 mg 1 p.o. twice daily #60.  Dispensed plantar fascial brace and discussed appropriate shoe gear.  Follow-up with her in 1 month.     Sujata Maines T. Wann, [place]

## 2024-07-14 DIAGNOSIS — M9904 Segmental and somatic dysfunction of sacral region: Secondary | ICD-10-CM | POA: Diagnosis not present

## 2024-07-14 DIAGNOSIS — M25511 Pain in right shoulder: Secondary | ICD-10-CM | POA: Diagnosis not present

## 2024-07-14 DIAGNOSIS — M9903 Segmental and somatic dysfunction of lumbar region: Secondary | ICD-10-CM | POA: Diagnosis not present

## 2024-07-14 DIAGNOSIS — M9901 Segmental and somatic dysfunction of cervical region: Secondary | ICD-10-CM | POA: Diagnosis not present

## 2024-07-14 DIAGNOSIS — M542 Cervicalgia: Secondary | ICD-10-CM | POA: Diagnosis not present

## 2024-07-14 DIAGNOSIS — M9902 Segmental and somatic dysfunction of thoracic region: Secondary | ICD-10-CM | POA: Diagnosis not present

## 2024-07-16 DIAGNOSIS — R208 Other disturbances of skin sensation: Secondary | ICD-10-CM | POA: Diagnosis not present

## 2024-07-16 DIAGNOSIS — D2262 Melanocytic nevi of left upper limb, including shoulder: Secondary | ICD-10-CM | POA: Diagnosis not present

## 2024-07-16 DIAGNOSIS — L820 Inflamed seborrheic keratosis: Secondary | ICD-10-CM | POA: Diagnosis not present

## 2024-07-16 DIAGNOSIS — D2271 Melanocytic nevi of right lower limb, including hip: Secondary | ICD-10-CM | POA: Diagnosis not present

## 2024-07-16 DIAGNOSIS — D2272 Melanocytic nevi of left lower limb, including hip: Secondary | ICD-10-CM | POA: Diagnosis not present

## 2024-07-16 DIAGNOSIS — L821 Other seborrheic keratosis: Secondary | ICD-10-CM | POA: Diagnosis not present

## 2024-07-16 DIAGNOSIS — D225 Melanocytic nevi of trunk: Secondary | ICD-10-CM | POA: Diagnosis not present

## 2024-07-16 DIAGNOSIS — D2261 Melanocytic nevi of right upper limb, including shoulder: Secondary | ICD-10-CM | POA: Diagnosis not present

## 2024-07-29 DIAGNOSIS — M542 Cervicalgia: Secondary | ICD-10-CM | POA: Diagnosis not present

## 2024-07-29 DIAGNOSIS — M9904 Segmental and somatic dysfunction of sacral region: Secondary | ICD-10-CM | POA: Diagnosis not present

## 2024-07-29 DIAGNOSIS — M9901 Segmental and somatic dysfunction of cervical region: Secondary | ICD-10-CM | POA: Diagnosis not present

## 2024-07-29 DIAGNOSIS — M9902 Segmental and somatic dysfunction of thoracic region: Secondary | ICD-10-CM | POA: Diagnosis not present

## 2024-07-29 DIAGNOSIS — M9903 Segmental and somatic dysfunction of lumbar region: Secondary | ICD-10-CM | POA: Diagnosis not present

## 2024-07-29 DIAGNOSIS — M25511 Pain in right shoulder: Secondary | ICD-10-CM | POA: Diagnosis not present

## 2024-08-10 DIAGNOSIS — N950 Postmenopausal bleeding: Secondary | ICD-10-CM | POA: Diagnosis not present

## 2024-08-11 DIAGNOSIS — M25511 Pain in right shoulder: Secondary | ICD-10-CM | POA: Diagnosis not present

## 2024-08-11 DIAGNOSIS — M9903 Segmental and somatic dysfunction of lumbar region: Secondary | ICD-10-CM | POA: Diagnosis not present

## 2024-08-11 DIAGNOSIS — M542 Cervicalgia: Secondary | ICD-10-CM | POA: Diagnosis not present

## 2024-08-11 DIAGNOSIS — M9901 Segmental and somatic dysfunction of cervical region: Secondary | ICD-10-CM | POA: Diagnosis not present

## 2024-08-11 DIAGNOSIS — M9904 Segmental and somatic dysfunction of sacral region: Secondary | ICD-10-CM | POA: Diagnosis not present

## 2024-08-11 DIAGNOSIS — M9902 Segmental and somatic dysfunction of thoracic region: Secondary | ICD-10-CM | POA: Diagnosis not present

## 2024-08-19 ENCOUNTER — Ambulatory Visit: Admitting: Internal Medicine

## 2024-08-19 ENCOUNTER — Ambulatory Visit: Admitting: Podiatry

## 2024-08-19 DIAGNOSIS — M722 Plantar fascial fibromatosis: Secondary | ICD-10-CM | POA: Diagnosis not present

## 2024-08-19 NOTE — Progress Notes (Signed)
 She presents today for follow-up of her plantar fasciitis of her left foot.  She states that is doing better but is still hurts a lot.  She states that I do not have the same excruciating pain that I had when I came in.  She is also wondering whether or not the anti-inflammatory may be causing her to swell.  Objective: Vital signs are stable alert and oriented x 3.  There is no erythema cellulitis drainage odor she has some mild edema about the ankles which is mildly pitting.  She has moderate tenderness on palpation medial calcaneal tubercle of the left heel.  Assessment: Plantar fasciitis left.  Plan: She still wanted me give her a shot so [facility] go physical therapy [facility] on [address] in [place] and I did decrease her Celebrex  200 mg once a day to see if that makes a difference also I explained to her that she may want to keep up with her blood pressure on a daily basis.  I will follow-up with her once physical therapy is done.

## 2024-08-25 DIAGNOSIS — M542 Cervicalgia: Secondary | ICD-10-CM | POA: Diagnosis not present

## 2024-08-25 DIAGNOSIS — M9904 Segmental and somatic dysfunction of sacral region: Secondary | ICD-10-CM | POA: Diagnosis not present

## 2024-08-25 DIAGNOSIS — M9901 Segmental and somatic dysfunction of cervical region: Secondary | ICD-10-CM | POA: Diagnosis not present

## 2024-08-25 DIAGNOSIS — M25511 Pain in right shoulder: Secondary | ICD-10-CM | POA: Diagnosis not present

## 2024-08-25 DIAGNOSIS — Z79890 Hormone replacement therapy (postmenopausal): Secondary | ICD-10-CM | POA: Diagnosis not present

## 2024-08-25 DIAGNOSIS — M9902 Segmental and somatic dysfunction of thoracic region: Secondary | ICD-10-CM | POA: Diagnosis not present

## 2024-08-25 DIAGNOSIS — M9903 Segmental and somatic dysfunction of lumbar region: Secondary | ICD-10-CM | POA: Diagnosis not present

## 2024-08-31 ENCOUNTER — Ambulatory Visit: Admitting: Physical Therapy

## 2024-08-31 DIAGNOSIS — M722 Plantar fascial fibromatosis: Secondary | ICD-10-CM | POA: Diagnosis not present

## 2024-08-31 DIAGNOSIS — M6281 Muscle weakness (generalized): Secondary | ICD-10-CM | POA: Diagnosis present

## 2024-08-31 DIAGNOSIS — M25572 Pain in left ankle and joints of left foot: Secondary | ICD-10-CM | POA: Insufficient documentation

## 2024-08-31 NOTE — Therapy (Signed)
 OUTPATIENT PHYSICAL THERAPY LOWER EXTREMITY EVALUATION   Patient Name: Kerry Garrett MRN: [id number] DOB:08-16-1971, 53 y.o., female Today's Date: 08/31/2024  END OF SESSION:  PT End of Session - 08/31/24 1528     Visit Number 1    Number of Visits 17    Date for Recertification  10/26/24    PT Start Time 1431    PT Stop Time 1517    PT Time Calculation (min) 46 min    Activity Tolerance Patient tolerated treatment well    Behavior During Therapy WFL for tasks assessed/performed          Past Medical History:  Diagnosis Date   Allergy    Claritin controls and Flonase    Asthma    as a CHILD- NO PROBELM AS ADULT   COVID-19    09/27/20   FH: melanoma    gets annual exams- Dr.Isenstein   GERD (gastroesophageal reflux disease)    Diet controlled   Migraines    Past Surgical History:  Procedure Laterality Date   CESAREAN SECTION     COLONOSCOPY WITH PROPOFOL  N/A 09/28/2021   Procedure: COLONOSCOPY WITH PROPOFOL ;  Surgeon: Unk Corinn Skiff, MD;  Location: [facility] ENDOSCOPY;  Service: Gastroenterology;  Laterality: N/A;   TONSILLECTOMY  1987   TUBAL LIGATION     Patient Active Problem List   Diagnosis Date Noted   Menstrual irregularity 02/16/2024   Snoring 02/16/2024   BPPV (benign paroxysmal positional vertigo), bilateral 07/19/2023   Colon cancer screening    Polyp of cecum    Family history of neoplasm of uncertain behavior of uterus 08/16/2021   Migraine 08/16/2021   Hypersomnia 06/19/2021   Irritable bowel syndrome with both constipation and diarrhea 06/19/2021   Decreased libido 06/19/2021   Chronic headaches 06/19/2021   Vertigo 06/19/2021   Cough 02/15/2021   B12 deficiency 02/15/2021   Encounter for general adult medical examination with abnormal findings 06/10/2020   FH: melanoma 06/10/2020   Allergic rhinitis 06/10/2020   Rectal spasm 06/10/2020   Immunizations incomplete 06/10/2020   History of 2019 novel coronavirus disease (COVID-19)  06/10/2020    PCP: Glendia Shad, MD  REFERRING PROVIDER: Verta Royden DASEN, MD  REFERRING DIAG: M72.2 (ICD-10-CM) - Plantar fasciitis  THERAPY DIAG:  Pain in left ankle and joints of left foot - Plan: PT plan of care cert/re-cert  Muscle weakness (generalized) - Plan: PT plan of care cert/re-cert  Rationale for Evaluation and Treatment: Rehabilitation  ONSET DATE: June 2025  SUBJECTIVE:   SUBJECTIVE STATEMENT: Pt is a 53 y.o. female referred to OPPT for L foot plantar fasciitis.   PERTINENT HISTORY: Pt reports gradual onset of L inferior heel pain where it gradual built up from June to September. Pt reports feels like she is walking on a pointed rock in the heel and achey in the medial arch. Pt does have pain the first few steps in the morning and with prolonged standing/walking. Pt's worst pain reported as a 6/10 NPS. Best pain reported 0/10 NPS. Pt reports current pain today 2/10 NPS. Pain improves with rest, new shoe wear and medicatoin. 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   PAIN:  Are you having pain? Yes: NPRS scale: 2/10 NPS Pain location: Inferior heel, medial arch posterior 1/3 Pain description: stepping on pointed rock, dull and achey Aggravating factors: standing/walking Relieving factors: rest,   PRECAUTIONS: None  RED FLAGS: None   WEIGHT BEARING RESTRICTIONS: No  FALLS:  Has patient fallen in last 6 months? No  LIVING ENVIRONMENT: Lives with: lives with their family   OCCUPATION: Astronomer for church  PLOF: Independent  PATIENT GOALS: Improve her pain  NEXT MD VISIT: N/A  OBJECTIVE:  Note: Objective measures were completed at Evaluation unless otherwise noted.  DIAGNOSTIC FINDINGS: Radiographs taken today demonstrate osseously mature individual some spurring is noted posterior and plantar heel.  No acute findings identified.  Soft tissue increase in density at the plantar fascial calcaneal  insertion site left heel.    PATIENT SURVEYS:  LEFS  Extreme difficulty/unable (0), Quite a bit of difficulty (1), Moderate difficulty (2), Little difficulty (3), No difficulty (4) Survey date:    Any of your usual work, housework or school activities 3  2. Usual hobbies, recreational or sporting activities 3  3. Getting into/out of the bath 4  4. Walking between rooms 3  5. Putting on socks/shoes 4  6. Squatting  3  7. Lifting an object, like a bag of groceries from the floor 4  8. Performing light activities around your home 3  9. Performing heavy activities around your home 3  10. Getting into/out of a car 4  11. Walking 2 blocks 2  12. Walking 1 mile 2  13. Going up/down 10 stairs (1 flight) 4  14. Standing for 1 hour 3  15.  sitting for 1 hour 3  16. Running on even ground 2  17. Running on uneven ground 2  18. Making sharp turns while running fast 3  19. Hopping  3  20. Rolling over in bed 4  Score total:  62/80     COGNITION: Overall cognitive status: Within functional limits for tasks assessed     SENSATION: WFL  EDEMA:  None present  POSTURE: No Significant postural limitations  PALPATION: TTP on heel pad on LLE and posterior third of medial arch  LOWER EXTREMITY ROM:  Active ROM Right eval Left eval  Hip flexion    Hip extension    Hip abduction    Hip adduction    Hip internal rotation    Hip external rotation    Knee flexion    Knee extension    Ankle dorsiflexion 2 8  Ankle plantarflexion 70 60  Ankle inversion 26 40  Ankle eversion 10 15  Great toe extension  55 35*   (Blank rows = not tested)  LOWER EXTREMITY MMT:  MMT Right eval Left eval  Hip flexion 5 5  Hip extension 5 4  Hip abduction 5 4  Hip adduction    Hip internal rotation 5 5  Hip external rotation 5 5  Knee flexion 5 5  Knee extension 5 5  Ankle dorsiflexion 5 5  Ankle plantarflexion 15 reps 15 reps  Ankle inversion 5 4  Ankle eversion 5 4   (Blank rows = not  tested)  LOWER EXTREMITY SPECIAL TESTS:  Ankle special tests: Windlassdeferred to next session  JOINT MOBILITY: Talocrural: A/P hypomobile and painless  FUNCTIONAL TESTS:  10 meter walk test: deferred to next session  SLS:  LLE: ankle righting reaction. 10 sec.  RLE: normal. 10 sec.  GAIT: Distance walked: 10' Assistive device utilized: None Level of assistance: Complete Independence Comments: normal  TREATMENT DATE: 08/31/24  There.Ex: Reviewed and provided HEP as listed below with education on reps/sets/frequency Discussed PT eval findings and POC going forward.      PATIENT EDUCATION:  Education details: HEP, prognosis, POC Person educated: Patient Education method: Explanation and Handouts Education comprehension: verbalized understanding  HOME EXERCISE PROGRAM: Access Code: [facility] URL: [email or web address] Date: 08/31/2024 Prepared by: Dorina Kingfisher  Exercises - Prone Hip Extension  - 1 x daily - 7 x weekly - 3 sets - 12 reps - Sidelying Hip Abduction  - 1 x daily - 7 x weekly - 3 sets - 12 reps - Big toe mobilization  - 1 x daily - 7 x weekly - 1 sets - 5 reps - 30 hold - Towel Scrunches  - 1 x daily - 7 x weekly - 1 sets - 5 reps  ASSESSMENT:  CLINICAL IMPRESSION: Patient is a 53 y.o. female who was seen today for physical therapy evaluation and treatment for L foot plantar fasciitis. Pt presents with deficits with L heel and arch pain with diminished ankle DF and great toe extension AROM along with proximal hip weakness and medial/lateral ankle weakness. Pt also scoring 62/80 on LEFS indicative of minimal diminished function due to current condition. Pt's deficits are leading to pain with standing/walking with ADL's. Pt will benefit from skilled PT services to address these deficits and maximize return to PLOF.    OBJECTIVE IMPAIRMENTS: decreased balance, decreased mobility, decreased ROM, decreased strength, hypomobility, and pain.   ACTIVITY LIMITATIONS: standing and locomotion level  PARTICIPATION LIMITATIONS: community activity  PERSONAL FACTORS: Age, Past/current experiences, and Time since onset of injury/illness/exacerbation are also affecting patient's functional outcome.   REHAB POTENTIAL: Good  CLINICAL DECISION MAKING: Stable/uncomplicated  EVALUATION COMPLEXITY: Low   GOALS: Goals reviewed with patient? No  SHORT TERM GOALS: Target date: 09/28/24 Pt will be independent with HEP to improve L foot pain and LLE strength to improve standing and walking ADL's. Baseline:08/31/24: Provided initial HEP Goal status: INITIAL  LONG TERM GOALS: Target date: 10/23/24  Pt will improve LEFS by at least 9 points to demonstrate clinically significant improvement in function due to L foot pain.  Baseline: 08/31/24: 62/80 Goal status: INITIAL  2.  Pt will improve L great toe extension AROM to improve plantar fascia mobility to improve pain and optimize gait mechanics.  Baseline: 08/31/24: 35 degrees (normal is 70-80) Goal status: INITIAL  3.  Pt will report worst pain as 3/10 NPS or less with standing and walking to demonstrate clinically significant reduction in pain with ADL's. Baseline: 08/31/24: worst pain 6/10 NPS  Goal status: INITIAL   PLAN:  PT FREQUENCY: 1-2x/week  PT DURATION: 8 weeks  PLANNED INTERVENTIONS: 97164- PT Re-evaluation, 97750- Physical Performance Testing, 97110-Therapeutic exercises, 97530- Therapeutic activity, [id number]- Neuromuscular re-education, 97535- Self Care, [id number]- Manual therapy, 97116- Gait training, [phone] (1-2 muscles), 20561 (3+ muscles)- Dry Needling, Patient/Family education, Balance training, Joint mobilization, Joint manipulation, Spinal manipulation, Spinal mobilization, Cryotherapy, and Moist heat  PLAN FOR NEXT SESSION: 10 meter gait screen.  Review HEP. Improve ankle DF and great toe extension. Manual Therapy as needed to improve L ankle mobility.    Dorina HERO. Fairly IV, PT, DPT Physical Therapist- [place]  [facility] 08/31/2024, 3:58 PM

## 2024-09-02 ENCOUNTER — Ambulatory Visit: Admitting: Physical Therapy

## 2024-09-02 DIAGNOSIS — M6281 Muscle weakness (generalized): Secondary | ICD-10-CM

## 2024-09-02 DIAGNOSIS — M25572 Pain in left ankle and joints of left foot: Secondary | ICD-10-CM

## 2024-09-02 NOTE — Therapy (Signed)
 OUTPATIENT PHYSICAL THERAPY LOWER EXTREMITY TREATMENT   Patient Name: Kerry Garrett MRN: [id number] DOB:[birth year], 53 y.o., female Today's Date: 09/02/2024  END OF SESSION:  PT End of Session - 09/02/24 1646     Visit Number 2    Number of Visits 17    Date for Recertification  10/26/24    PT Start Time 1645    PT Stop Time 1725    PT Time Calculation (min) 40 min    Activity Tolerance Patient tolerated treatment well    Behavior During Therapy WFL for tasks assessed/performed          Past Medical History:  Diagnosis Date   Allergy    Claritin controls and Flonase    Asthma    as a CHILD- NO PROBELM AS ADULT   COVID-19    09/27/20   FH: melanoma    gets annual exams- Dr.Isenstein   GERD (gastroesophageal reflux disease)    Diet controlled   Migraines    Past Surgical History:  Procedure Laterality Date   CESAREAN SECTION     COLONOSCOPY WITH PROPOFOL  N/A 09/28/2021   Procedure: COLONOSCOPY WITH PROPOFOL ;  Surgeon: Unk Corinn Skiff, MD;  Location: [facility] ENDOSCOPY;  Service: Gastroenterology;  Laterality: N/A;   TONSILLECTOMY  1987   TUBAL LIGATION     Patient Active Problem List   Diagnosis Date Noted   Menstrual irregularity 02/16/2024   Snoring 02/16/2024   BPPV (benign paroxysmal positional vertigo), bilateral 07/19/2023   Colon cancer screening    Polyp of cecum    Family history of neoplasm of uncertain behavior of uterus 08/16/2021   Migraine 08/16/2021   Hypersomnia 06/19/2021   Irritable bowel syndrome with both constipation and diarrhea 06/19/2021   Decreased libido 06/19/2021   Chronic headaches 06/19/2021   Vertigo 06/19/2021   Cough 02/15/2021   B12 deficiency 02/15/2021   Encounter for general adult medical examination with abnormal findings 06/10/2020   FH: melanoma 06/10/2020   Allergic rhinitis 06/10/2020   Rectal spasm 06/10/2020   Immunizations incomplete 06/10/2020   History of 2019 novel coronavirus disease (COVID-19)  06/10/2020    PCP: Glendia Shad, MD  REFERRING PROVIDER: Verta Royden DASEN, MD  REFERRING DIAG: M72.2 (ICD-10-CM) - Plantar fasciitis  THERAPY DIAG:  Pain in left ankle and joints of left foot  Muscle weakness (generalized)  Rationale for Evaluation and Treatment: Rehabilitation  ONSET DATE: June 2025  SUBJECTIVE:   SUBJECTIVE STATEMENT: Pt reports compliance with HEP. Mild pain due to not having to be on her feet a lot the last couple days.   PERTINENT HISTORY: Pt reports gradual onset of L inferior heel pain where it gradual built up from June to September. Pt reports feels like she is walking on a pointed rock in the heel and achey in the medial arch. Pt does have pain the first few steps in the morning and with prolonged standing/walking. Pt's worst pain reported as a 6/10 NPS. Best pain reported 0/10 NPS. Pt reports current pain today 2/10 NPS. Pain improves with rest, new shoe wear and medicatoin. 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   PAIN:  Are you having pain? Yes: NPRS scale: 2/10 NPS Pain location: Inferior heel, medial arch posterior 1/3 Pain description: stepping on pointed rock, dull and achey Aggravating factors: standing/walking Relieving factors: rest,   PRECAUTIONS: None  RED FLAGS: None   WEIGHT BEARING RESTRICTIONS: No  FALLS:  Has patient fallen in  last 6 months? No  LIVING ENVIRONMENT: Lives with: lives with their family   OCCUPATION: Astronomer for church  PLOF: Independent  PATIENT GOALS: Improve her pain  NEXT MD VISIT: N/A  OBJECTIVE:  Note: Objective measures were completed at Evaluation unless otherwise noted.  DIAGNOSTIC FINDINGS: Radiographs taken today demonstrate osseously mature individual some spurring is noted posterior and plantar heel.  No acute findings identified.  Soft tissue increase in density at the plantar fascial calcaneal insertion site left heel.    PATIENT  SURVEYS:  LEFS  Extreme difficulty/unable (0), Quite a bit of difficulty (1), Moderate difficulty (2), Little difficulty (3), No difficulty (4) Survey date:    Any of your usual work, housework or school activities 3  2. Usual hobbies, recreational or sporting activities 3  3. Getting into/out of the bath 4  4. Walking between rooms 3  5. Putting on socks/shoes 4  6. Squatting  3  7. Lifting an object, like a bag of groceries from the floor 4  8. Performing light activities around your home 3  9. Performing heavy activities around your home 3  10. Getting into/out of a car 4  11. Walking 2 blocks 2  12. Walking 1 mile 2  13. Going up/down 10 stairs (1 flight) 4  14. Standing for 1 hour 3  15.  sitting for 1 hour 3  16. Running on even ground 2  17. Running on uneven ground 2  18. Making sharp turns while running fast 3  19. Hopping  3  20. Rolling over in bed 4  Score total:  62/80     COGNITION: Overall cognitive status: Within functional limits for tasks assessed     SENSATION: WFL  EDEMA:  None present  POSTURE: No Significant postural limitations  PALPATION: TTP on heel pad on LLE and posterior third of medial arch  LOWER EXTREMITY ROM:  Active ROM Right eval Left eval  Hip flexion    Hip extension    Hip abduction    Hip adduction    Hip internal rotation    Hip external rotation    Knee flexion    Knee extension    Ankle dorsiflexion 2 8  Ankle plantarflexion 70 60  Ankle inversion 26 40  Ankle eversion 10 15  Great toe extension  55 35*   (Blank rows = not tested)  LOWER EXTREMITY MMT:  MMT Right eval Left eval  Hip flexion 5 5  Hip extension 5 4  Hip abduction 5 4  Hip adduction    Hip internal rotation 5 5  Hip external rotation 5 5  Knee flexion 5 5  Knee extension 5 5  Ankle dorsiflexion 5 5  Ankle plantarflexion 15 reps 15 reps  Ankle inversion 5 4  Ankle eversion 5 4   (Blank rows = not tested)  LOWER EXTREMITY SPECIAL  TESTS:  Ankle special tests: Windlassdeferred to next session  JOINT MOBILITY: Talocrural: A/P hypomobile and painless  FUNCTIONAL TESTS:  10 meter walk test: deferred to next session  SLS:  LLE: ankle righting reaction. 10 sec.  RLE: normal. 10 sec.  GAIT: Distance walked: 10' Assistive device utilized: None Level of assistance: Complete Independence Comments: normal  TREATMENT DATE: 08/31/24  Manual Therapy: 10 min long sitting STM to L plantar fascia. 5 min. Discovered trigger point at medial fascia with concordant pain with palpation. TPR techniques performed to improve pain. Notably decreased after TPR techniques with reduced pain.   Grade 4 L ankle TC mobs AP for improved ankle DF AROM. 3x15 bouts with 5 sec/bout.   Great toe extension + gastroc stretch: 5x20 sec holds.    There.Ex: Seated L great toe stretch slide: 5x30 sec holds. Min VC's for form/technique. Good carryover.   Towel scrunches: 5 reps of towel length  Side lying hip abduction with GTB at ankles: 3x12/side   Prone hip extension with GTB at ankles: 3x12/side   10 meter gait speed: 1.1 m/s no antalgic gait appreciated.   Provided GTB for hip exercises.   PATIENT EDUCATION:  Education details: HEP, prognosis, POC Person educated: Patient Education method: Explanation and Handouts Education comprehension: verbalized understanding  HOME EXERCISE PROGRAM: Access Code: [facility] URL: [email or web address] Date: 08/31/2024 Prepared by: Dorina Kingfisher  Exercises - Prone Hip Extension  - 1 x daily - 7 x weekly - 3 sets - 12 reps - Sidelying Hip Abduction  - 1 x daily - 7 x weekly - 3 sets - 12 reps - Big toe mobilization  - 1 x daily - 7 x weekly - 1 sets - 5 reps - 30 hold - Towel Scrunches  - 1 x daily - 7 x weekly - 1 sets - 5 reps  ASSESSMENT:  CLINICAL  IMPRESSION: Pt returning to PT for treatment. Focus of session on manual therapy and therex to improve plantar fascia mobility and pain. Addition of hip strengthening this date to support ankle in standing with walking ADL's. Pt reports symptom relief post manual therapy to concordant trigger point to medial fascia. Pt will benefit from skilled PT services to address these deficits and maximize return to PLOF.    OBJECTIVE IMPAIRMENTS: decreased balance, decreased mobility, decreased ROM, decreased strength, hypomobility, and pain.   ACTIVITY LIMITATIONS: standing and locomotion level  PARTICIPATION LIMITATIONS: community activity  PERSONAL FACTORS: Age, Past/current experiences, and Time since onset of injury/illness/exacerbation are also affecting patient's functional outcome.   REHAB POTENTIAL: Good  CLINICAL DECISION MAKING: Stable/uncomplicated  EVALUATION COMPLEXITY: Low   GOALS: Goals reviewed with patient? No  SHORT TERM GOALS: Target date: 09/28/24 Pt will be independent with HEP to improve L foot pain and LLE strength to improve standing and walking ADL's. Baseline:08/31/24: Provided initial HEP Goal status: INITIAL  LONG TERM GOALS: Target date: 10/23/24  Pt will improve LEFS by at least 9 points to demonstrate clinically significant improvement in function due to L foot pain.  Baseline: 08/31/24: 62/80 Goal status: INITIAL  2.  Pt will improve L great toe extension AROM to improve plantar fascia mobility to improve pain and optimize gait mechanics.  Baseline: 08/31/24: 35 degrees (normal is 70-80) Goal status: INITIAL  3.  Pt will report worst pain as 3/10 NPS or less with standing and walking to demonstrate clinically significant reduction in pain with ADL's. Baseline: 08/31/24: worst pain 6/10 NPS  Goal status: INITIAL   PLAN:  PT FREQUENCY: 1-2x/week  PT DURATION: 8 weeks  PLANNED INTERVENTIONS: 97164- PT Re-evaluation, 97750- Physical Performance Testing,  97110-Therapeutic exercises, 97530- Therapeutic activity, 97112- Neuromuscular re-education, 97535- Self Care, 02859- Manual therapy, [phone]- Gait training, [phone] (1-2 muscles), 20561 (3+ muscles)- Dry Needling, Patient/Family education, Balance training, Joint mobilization, Joint manipulation, Spinal manipulation, Spinal mobilization, Cryotherapy, and Moist heat  PLAN FOR NEXT SESSION: L ankle inversion/eversion strength   Dorina HERO. Fairly IV, PT, DPT Physical Therapist- [place]  [facility] 09/02/2024, 5:33 PM

## 2024-09-03 ENCOUNTER — Ambulatory Visit: Admitting: Physical Therapy

## 2024-09-07 ENCOUNTER — Ambulatory Visit: Admitting: Physical Therapy

## 2024-09-07 DIAGNOSIS — M6281 Muscle weakness (generalized): Secondary | ICD-10-CM

## 2024-09-07 DIAGNOSIS — M25572 Pain in left ankle and joints of left foot: Secondary | ICD-10-CM | POA: Diagnosis not present

## 2024-09-07 NOTE — Therapy (Signed)
 " OUTPATIENT PHYSICAL THERAPY LOWER EXTREMITY TREATMENT   Patient Name: Kerry Garrett MRN: [id number] DOB:Jun 16, 1971, 53 y.o., female Today's Date: 09/07/2024  END OF SESSION:  PT End of Session - 09/07/24 1650     Visit Number 3    Number of Visits 17    Date for Recertification  10/26/24    Authorization - Visit Number 3    Progress Note Due on Visit 10    PT Start Time 1645    PT Stop Time 1730    PT Time Calculation (min) 45 min    Activity Tolerance Patient tolerated treatment well    Behavior During Therapy WFL for tasks assessed/performed          Past Medical History:  Diagnosis Date   Allergy    Claritin controls and Flonase    Asthma    as a CHILD- NO PROBELM AS ADULT   COVID-19    09/27/20   FH: melanoma    gets annual exams- Dr.Isenstein   GERD (gastroesophageal reflux disease)    Diet controlled   Migraines    Past Surgical History:  Procedure Laterality Date   CESAREAN SECTION     COLONOSCOPY WITH PROPOFOL  N/A 09/28/2021   Procedure: COLONOSCOPY WITH PROPOFOL ;  Surgeon: Unk Corinn Skiff, MD;  Location: [facility] ENDOSCOPY;  Service: Gastroenterology;  Laterality: N/A;   TONSILLECTOMY  1987   TUBAL LIGATION     Patient Active Problem List   Diagnosis Date Noted   Menstrual irregularity 02/16/2024   Snoring 02/16/2024   BPPV (benign paroxysmal positional vertigo), bilateral 07/19/2023   Colon cancer screening    Polyp of cecum    Family history of neoplasm of uncertain behavior of uterus 08/16/2021   Migraine 08/16/2021   Hypersomnia 06/19/2021   Irritable bowel syndrome with both constipation and diarrhea 06/19/2021   Decreased libido 06/19/2021   Chronic headaches 06/19/2021   Vertigo 06/19/2021   Cough 02/15/2021   B12 deficiency 02/15/2021   Encounter for general adult medical examination with abnormal findings 06/10/2020   FH: melanoma 06/10/2020   Allergic rhinitis 06/10/2020   Rectal spasm 06/10/2020   Immunizations incomplete  06/10/2020   History of 2019 novel coronavirus disease (COVID-19) 06/10/2020    PCP: Glendia Shad, MD  REFERRING PROVIDER: Verta Royden DASEN, MD  REFERRING DIAG: M72.2 (ICD-10-CM) - Plantar fasciitis  THERAPY DIAG:  Pain in left ankle and joints of left foot  Muscle weakness (generalized)  Rationale for Evaluation and Treatment: Rehabilitation  ONSET DATE: June 2025  SUBJECTIVE:   SUBJECTIVE STATEMENT: Pt reports that she feels more of her pain today in her medial longitudinal arch.    PERTINENT HISTORY: Pt reports gradual onset of L inferior heel pain where it gradual built up from June to September. Pt reports feels like she is walking on a pointed rock in the heel and achey in the medial arch. Pt does have pain the first few steps in the morning and with prolonged standing/walking. Pt's worst pain reported as a 6/10 NPS. Best pain reported 0/10 NPS. Pt reports current pain today 2/10 NPS. Pain improves with rest, new shoe wear and medicatoin. 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   PAIN:  Are you having pain? Yes: NPRS scale: 2/10 NPS Pain location: Inferior heel, medial arch posterior 1/3 Pain description: stepping on pointed rock, dull and achey Aggravating factors: standing/walking Relieving factors: rest,   PRECAUTIONS: None  RED FLAGS: None  WEIGHT BEARING RESTRICTIONS: No  FALLS:  Has patient fallen in last 6 months? No  LIVING ENVIRONMENT: Lives with: lives with their family   OCCUPATION: Astronomer for church  PLOF: Independent  PATIENT GOALS: Improve her pain  NEXT MD VISIT: N/A  OBJECTIVE:  Note: Objective measures were completed at Evaluation unless otherwise noted.  DIAGNOSTIC FINDINGS: Radiographs taken today demonstrate osseously mature individual some spurring is noted posterior and plantar heel.  No acute findings identified.  Soft tissue increase in density at the plantar fascial calcaneal  insertion site left heel.    PATIENT SURVEYS:  LEFS  Extreme difficulty/unable (0), Quite a bit of difficulty (1), Moderate difficulty (2), Little difficulty (3), No difficulty (4) Survey date:    Any of your usual work, housework or school activities 3  2. Usual hobbies, recreational or sporting activities 3  3. Getting into/out of the bath 4  4. Walking between rooms 3  5. Putting on socks/shoes 4  6. Squatting  3  7. Lifting an object, like a bag of groceries from the floor 4  8. Performing light activities around your home 3  9. Performing heavy activities around your home 3  10. Getting into/out of a car 4  11. Walking 2 blocks 2  12. Walking 1 mile 2  13. Going up/down 10 stairs (1 flight) 4  14. Standing for 1 hour 3  15.  sitting for 1 hour 3  16. Running on even ground 2  17. Running on uneven ground 2  18. Making sharp turns while running fast 3  19. Hopping  3  20. Rolling over in bed 4  Score total:  62/80     COGNITION: Overall cognitive status: Within functional limits for tasks assessed     SENSATION: WFL  EDEMA:  None present  POSTURE: No Significant postural limitations  PALPATION: TTP on heel pad on LLE and posterior third of medial arch  LOWER EXTREMITY ROM:  Active ROM Right eval Left eval  Hip flexion    Hip extension    Hip abduction    Hip adduction    Hip internal rotation    Hip external rotation    Knee flexion    Knee extension    Ankle dorsiflexion 2 8  Ankle plantarflexion 70 60  Ankle inversion 26 40  Ankle eversion 10 15  Great toe extension  55 35*   (Blank rows = not tested)  LOWER EXTREMITY MMT:  MMT Right eval Left eval  Hip flexion 5 5  Hip extension 5 4  Hip abduction 5 4  Hip adduction    Hip internal rotation 5 5  Hip external rotation 5 5  Knee flexion 5 5  Knee extension 5 5  Ankle dorsiflexion 5 5  Ankle plantarflexion 15 reps 15 reps  Ankle inversion 5 4  Ankle eversion 5 4   (Blank rows = not  tested)  LOWER EXTREMITY SPECIAL TESTS:  Ankle special tests: Windlassdeferred to next session  JOINT MOBILITY: Talocrural: A/P hypomobile and painless  FUNCTIONAL TESTS:  10 meter walk test: deferred to next session  SLS:  LLE: ankle righting reaction. 10 sec.  RLE: normal. 10 sec.  GAIT: Distance walked: 10' Assistive device utilized: None Level of assistance: Complete Independence Comments: normal  TREATMENT DATE:   09/07/24: MANUAL  Small Mercer Orts with lacrosse ball x 60  -education about how to perform    THEREX: All exercises performed on left ankle      Seated Calf Stretch 3 x 30 sec  -min VC to keep knee straight   Great Toe Extension stretch in cross legged position 2 x 30 sec    Figure 4 ankle inversion AROM 1 x 10   Figure 4 ankle inversion with yellow band 2 x 10       PATIENT EDUCATION:  Education details: HEP, prognosis, POC Person educated: Patient Education method: Explanation and Handouts Education comprehension: verbalized understanding  HOME EXERCISE PROGRAM: Access Code: [facility] URL: [email or web address] Date: 09/07/2024 Prepared by: Toribio Servant  Program Notes Seated Ball roll    Exercises - Long Sitting Calf Stretch with Strap  - 1 x daily - 7 x weekly - 3 reps - 60 sec sec  hold - Prone Hip Extension  - 1 x daily - 7 x weekly - 3 sets - 12 reps - Sidelying Hip Abduction  - 1 x daily - 7 x weekly - 3 sets - 12 reps - Big toe mobilization  - 1 x daily - 7 x weekly - 1 sets - 5 reps - 30 hold - Towel Scrunches  - 1 x daily - 7 x weekly - 1 sets - 5 reps - Seated Figure 4 Ankle Inversion with Resistance  - 3-4 x weekly - 3 sets - 10 reps  ASSESSMENT:  CLINICAL IMPRESSION: Pt shows ongoing improvement with left ankle function with ability to perform all exercises without being limited by  pain. She does feel pain along medial longitudinal arch. Pt will benefit from skilled PT services to address these deficits and maximize return to PLOF.  OBJECTIVE IMPAIRMENTS: decreased balance, decreased mobility, decreased ROM, decreased strength, hypomobility, and pain.   ACTIVITY LIMITATIONS: standing and locomotion level  PARTICIPATION LIMITATIONS: community activity  PERSONAL FACTORS: Age, Past/current experiences, and Time since onset of injury/illness/exacerbation are also affecting patient's functional outcome.   REHAB POTENTIAL: Good  CLINICAL DECISION MAKING: Stable/uncomplicated  EVALUATION COMPLEXITY: Low   GOALS: Goals reviewed with patient? No  SHORT TERM GOALS: Target date: 09/28/24 Pt will be independent with HEP to improve L foot pain and LLE strength to improve standing and walking ADL's. Baseline:08/31/24: Provided initial HEP  09/07/24: Performing HEP independently  Goal status: ACHIEVED    LONG TERM GOALS: Target date: 10/23/24  Pt will improve LEFS by at least 9 points to demonstrate clinically significant improvement in function due to L foot pain.  Baseline: 08/31/24: 62/80 Goal status: ONGOING    2.  Pt will improve L great toe extension AROM to improve plantar fascia mobility to improve pain and optimize gait mechanics.  Baseline: 08/31/24: 35 degrees (normal is 70-80) Goal status: ONGOING   3.  Pt will report worst pain as 3/10 NPS or less with standing and walking to demonstrate clinically significant reduction in pain with ADL's. Baseline: 08/31/24: worst pain 6/10 NPS  Goal status: ONGOING      PLAN:  PT FREQUENCY: 1-2x/week  PT DURATION: 8 weeks  PLANNED INTERVENTIONS: 97164- PT Re-evaluation, 97750- Physical Performance Testing, 97110-Therapeutic exercises, 97530- Therapeutic activity, [id number]- Neuromuscular re-education, 97535- Self Care, 02859- Manual therapy, 97116- Gait training, [id number] (1-2 muscles), 20561 (3+ muscles)- Dry Needling,  Patient/Family education, Balance training, Joint mobilization, Joint manipulation, Spinal manipulation, Spinal mobilization, Cryotherapy, and Moist heat  PLAN FOR NEXT SESSION: L  ankle eversion strength seated banded inversion. Doming for ankle intrinsics. Forward lunges to promote great toe mobility and hip strengthening start in static position and then attempt dynamic.   Toribio Servant PT, DPT  [facility] [address] [place], [address] Phone: [phone]   Fax:  [phone]   "

## 2024-09-22 ENCOUNTER — Ambulatory Visit: Payer: Self-pay | Attending: Podiatry

## 2024-09-22 DIAGNOSIS — M6281 Muscle weakness (generalized): Secondary | ICD-10-CM | POA: Diagnosis present

## 2024-09-22 DIAGNOSIS — M25572 Pain in left ankle and joints of left foot: Secondary | ICD-10-CM | POA: Insufficient documentation

## 2024-09-22 NOTE — Therapy (Signed)
 " OUTPATIENT PHYSICAL THERAPY LOWER EXTREMITY TREATMENT   Patient Name: Kerry Garrett MRN: [id number] DOB:Jul 20, 1971, 54 y.o., female Today's Date: 09/22/2024  END OF SESSION:  PT End of Session - 09/22/24 0954     Visit Number 4    Number of Visits 17    Date for Recertification  10/26/24    Progress Note Due on Visit 10    PT Start Time 0953    PT Stop Time 1042    PT Time Calculation (min) 49 min    Activity Tolerance Patient tolerated treatment well    Behavior During Therapy [facility] for tasks assessed/performed           Past Medical History:  Diagnosis Date   Allergy    Claritin controls and Flonase    Asthma    as a CHILD- NO PROBELM AS ADULT   COVID-19    09/27/20   FH: melanoma    gets annual exams- Dr.Isenstein   GERD (gastroesophageal reflux disease)    Diet controlled   Migraines    Past Surgical History:  Procedure Laterality Date   CESAREAN SECTION     COLONOSCOPY WITH PROPOFOL  N/A 09/28/2021   Procedure: COLONOSCOPY WITH PROPOFOL ;  Surgeon: Unk Corinn Skiff, MD;  Location: [facility] ENDOSCOPY;  Service: Gastroenterology;  Laterality: N/A;   TONSILLECTOMY  1987   TUBAL LIGATION     Patient Active Problem List   Diagnosis Date Noted   Menstrual irregularity 02/16/2024   Snoring 02/16/2024   BPPV (benign paroxysmal positional vertigo), bilateral 07/19/2023   Colon cancer screening    Polyp of cecum    Family history of neoplasm of uncertain behavior of uterus 08/16/2021   Migraine 08/16/2021   Hypersomnia 06/19/2021   Irritable bowel syndrome with both constipation and diarrhea 06/19/2021   Decreased libido 06/19/2021   Chronic headaches 06/19/2021   Vertigo 06/19/2021   Cough 02/15/2021   B12 deficiency 02/15/2021   Encounter for general adult medical examination with abnormal findings 06/10/2020   FH: melanoma 06/10/2020   Allergic rhinitis 06/10/2020   Rectal spasm 06/10/2020   Immunizations incomplete 06/10/2020   History of 2019 novel  coronavirus disease (COVID-19) 06/10/2020    PCP: Glendia Shad, MD  REFERRING PROVIDER: Verta Royden DASEN, MD  REFERRING DIAG: M72.2 (ICD-10-CM) - Plantar fasciitis  THERAPY DIAG:  Pain in left ankle and joints of left foot  Muscle weakness (generalized)  Rationale for Evaluation and Treatment: Rehabilitation  ONSET DATE: June 2025  SUBJECTIVE:   SUBJECTIVE STATEMENT: Pt reports 1/10 pain upon arrival to the clinic.  Pt notes that she has been having some sciatic nerve symptoms on the L LE over the past week or so.  Pt notes that she's noted it more in the morning.  PERTINENT HISTORY: Pt reports gradual onset of L inferior heel pain where it gradual built up from June to September. Pt reports feels like she is walking on a pointed rock in the heel and achey in the medial arch. Pt does have pain the first few steps in the morning and with prolonged standing/walking. Pt's worst pain reported as a 6/10 NPS. Best pain reported 0/10 NPS. Pt reports current pain today 2/10 NPS. Pain improves with rest, new shoe wear and medicatoin. 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   PAIN:  Are you having pain? Yes: NPRS scale: 2/10 NPS Pain location: Inferior heel, medial arch posterior 1/3 Pain description: stepping on pointed rock,  dull and achey Aggravating factors: standing/walking Relieving factors: rest,   PRECAUTIONS: None  RED FLAGS: None   WEIGHT BEARING RESTRICTIONS: No  FALLS:  Has patient fallen in last 6 months? No  LIVING ENVIRONMENT: Lives with: lives with their family   OCCUPATION: Astronomer for church  PLOF: Independent  PATIENT GOALS: Improve her pain  NEXT MD VISIT: N/A  OBJECTIVE:  Note: Objective measures were completed at Evaluation unless otherwise noted.  DIAGNOSTIC FINDINGS: Radiographs taken today demonstrate osseously mature individual some spurring is noted posterior and plantar heel.  No acute  findings identified.  Soft tissue increase in density at the plantar fascial calcaneal insertion site left heel.    PATIENT SURVEYS:  LEFS  Extreme difficulty/unable (0), Quite a bit of difficulty (1), Moderate difficulty (2), Little difficulty (3), No difficulty (4) Survey date:    Any of your usual work, housework or school activities 3  2. Usual hobbies, recreational or sporting activities 3  3. Getting into/out of the bath 4  4. Walking between rooms 3  5. Putting on socks/shoes 4  6. Squatting  3  7. Lifting an object, like a bag of groceries from the floor 4  8. Performing light activities around your home 3  9. Performing heavy activities around your home 3  10. Getting into/out of a car 4  11. Walking 2 blocks 2  12. Walking 1 mile 2  13. Going up/down 10 stairs (1 flight) 4  14. Standing for 1 hour 3  15.  sitting for 1 hour 3  16. Running on even ground 2  17. Running on uneven ground 2  18. Making sharp turns while running fast 3  19. Hopping  3  20. Rolling over in bed 4  Score total:  62/80     COGNITION: Overall cognitive status: Within functional limits for tasks assessed     SENSATION: WFL  EDEMA:  None present  POSTURE: No Significant postural limitations  PALPATION: TTP on heel pad on LLE and posterior third of medial arch  LOWER EXTREMITY ROM:  Active ROM Right eval Left eval  Hip flexion    Hip extension    Hip abduction    Hip adduction    Hip internal rotation    Hip external rotation    Knee flexion    Knee extension    Ankle dorsiflexion 2 8  Ankle plantarflexion 70 60  Ankle inversion 26 40  Ankle eversion 10 15  Great toe extension  55 35*   (Blank rows = not tested)  LOWER EXTREMITY MMT:  MMT Right eval Left eval  Hip flexion 5 5  Hip extension 5 4  Hip abduction 5 4  Hip adduction    Hip internal rotation 5 5  Hip external rotation 5 5  Knee flexion 5 5  Knee extension 5 5  Ankle dorsiflexion 5 5  Ankle  plantarflexion 15 reps 15 reps  Ankle inversion 5 4  Ankle eversion 5 4   (Blank rows = not tested)  LOWER EXTREMITY SPECIAL TESTS:  Ankle special tests: Windlassdeferred to next session  JOINT MOBILITY: Talocrural: A/P hypomobile and painless  FUNCTIONAL TESTS:  10 meter walk test: deferred to next session  SLS:  LLE: ankle righting reaction. 10 sec.  RLE: normal. 10 sec.  GAIT: Distance walked: 10' Assistive device utilized: None Level of assistance: Complete Independence Comments: normal  TREATMENT DATE: 09/22/2024   Manual:   Supine STM with TP release technique applied to the plantar fascia for pain modulation and increased tissue extensibility, use of IASTM at times for increased effectiveness Supine fan mobilization of the metatarsals for improved mobility within the foot, 30 sec bouts each segment Supine talocrural AP glide mobilizations, Grades II-III, 30 sec bouts x2 for increased ankle ROM Prone STM with TP release technique applied to the gastroc/soleus complex for pain modulation and increased tissue extensibility, use of IASTM at times for increased effectiveness   TherEx:   Seated towel scrunches, x5 total attempts grasping the length of longer towel Seated toe yoga including great toe extension, lesser toes extension, and toe splaying 2x10 each activity    Neuro:  Seated arch lifts, 3 sec holds, 2x10 for increased proprioception of the arch and muscles that contribute to the proper foot dynamics  Supine piriformis stretch to alleviate the sciatic nerve pain she's experiencing, 30 sec bouts  Supine sciatic nerve glides with use of green stretch strap, 30 sec bouts x4 Education provided for performance of nerve glides and rationale behind the use of it for the current issues she's experiencing    PATIENT EDUCATION:  Education  details: HEP, prognosis, POC Person educated: Patient Education method: Explanation and Handouts Education comprehension: verbalized understanding  HOME EXERCISE PROGRAM: Access Code: [email or web address] URL: [email or web address] Date: 09/22/2024 Prepared by: Sidra Simpers  Exercises - Seated Arch Lifts  - 1 x daily - 7 x weekly - 3 sets - 10 reps - 5 hold - Towel Scrunches  - 1 x daily - 7 x weekly - 3 sets - 10 reps - 5 hold - Ankle Inversion Eversion Towel Slide  - 1 x daily - 7 x weekly - 3 sets - 10 reps - 5 hold - Seated Great Toe Extension  - 1 x daily - 7 x weekly - 3 sets - 10 reps - Toe Spreading  - 1 x daily - 7 x weekly - 3 sets - 10 reps - Seated Toe Curl  - 1 x daily - 7 x weekly - 3 sets - 10 reps - Seated Lesser Toes Extension  - 1 x daily - 7 x weekly - 3 sets - 10 reps - Standing Eccentric Heel Raise From Step With Towel Under Toes  - 1 x daily - 7 x weekly - 3 sets - 10 reps  ASSESSMENT:  CLINICAL IMPRESSION:  Pt responded well to the exercises and noted a reduction in the overall pain she was experiencing, and a reduction in discomfort felt during the manual therapy approach.  Pt noted to have significant tightness in the calf structure and plantar fascia that was addressed during the session today.  Pt ultimately given a new HEP to promote increased ankle stability and improve proprioception of the ankle/foot musculature.  Pt very appreciative of instruction and the exercises given.   Pt will continue to benefit from skilled therapy to address remaining deficits in order to improve overall QoL and return to PLOF.     OBJECTIVE IMPAIRMENTS: decreased balance, decreased mobility, decreased ROM, decreased strength, hypomobility, and pain.   ACTIVITY LIMITATIONS: standing and locomotion level  PARTICIPATION LIMITATIONS: community activity  PERSONAL FACTORS: Age, Past/current experiences, and Time since onset of injury/illness/exacerbation are also affecting  patient's functional outcome.   REHAB POTENTIAL: Good  CLINICAL DECISION MAKING: Stable/uncomplicated  EVALUATION COMPLEXITY: Low   GOALS: Goals reviewed with patient? No  SHORT TERM GOALS: Target date: 09/28/24 Pt  will be independent with HEP to improve L foot pain and LLE strength to improve standing and walking ADL's. Baseline:08/31/24: Provided initial HEP  09/07/24: Performing HEP independently  Goal status: ACHIEVED    LONG TERM GOALS: Target date: 10/23/24  Pt will improve LEFS by at least 9 points to demonstrate clinically significant improvement in function due to L foot pain.  Baseline: 08/31/24: 62/80 Goal status: ONGOING    2.  Pt will improve L great toe extension AROM to improve plantar fascia mobility to improve pain and optimize gait mechanics.  Baseline: 08/31/24: 35 degrees (normal is 70-80) Goal status: ONGOING   3.  Pt will report worst pain as 3/10 NPS or less with standing and walking to demonstrate clinically significant reduction in pain with ADL's. Baseline: 08/31/24: worst pain 6/10 NPS  Goal status: ONGOING      PLAN:  PT FREQUENCY: 1-2x/week  PT DURATION: 8 weeks  PLANNED INTERVENTIONS: 97164- PT Re-evaluation, 97750- Physical Performance Testing, 97110-Therapeutic exercises, 97530- Therapeutic activity, [id number]- Neuromuscular re-education, 97535- Self Care, 02859- Manual therapy, 97116- Gait training, [phone] (1-2 muscles), 20561 (3+ muscles)- Dry Needling, Patient/Family education, Balance training, Joint mobilization, Joint manipulation, Spinal manipulation, Spinal mobilization, Cryotherapy, and Moist heat  PLAN FOR NEXT SESSION:  L ankle eversion strength seated banded inversion. Doming for ankle intrinsics. Forward lunges to promote great toe mobility and hip strengthening start in static position and then attempt dynamic.    Fonda Simpers, PT, DPT Physical Therapist - [facility]  09/22/2024, 11:16 AM    "

## 2024-09-24 ENCOUNTER — Ambulatory Visit: Payer: Self-pay

## 2024-09-30 ENCOUNTER — Ambulatory Visit: Admitting: Podiatry

## 2024-10-06 ENCOUNTER — Ambulatory Visit

## 2024-10-08 ENCOUNTER — Ambulatory Visit

## 2024-10-08 DIAGNOSIS — M25572 Pain in left ankle and joints of left foot: Secondary | ICD-10-CM | POA: Diagnosis not present

## 2024-10-08 DIAGNOSIS — M6281 Muscle weakness (generalized): Secondary | ICD-10-CM

## 2024-10-08 NOTE — Therapy (Signed)
 " OUTPATIENT PHYSICAL THERAPY LOWER EXTREMITY TREATMENT   Patient Name: Kerry Garrett MRN: [id number] DOB:[birth year], 54 y.o., female Today's Date: 10/08/2024  END OF SESSION:  PT End of Session - 10/08/24 1654     Visit Number 5    Number of Visits 17    Date for Recertification  10/26/24    Progress Note Due on Visit 10    PT Start Time 1652    PT Stop Time 1730    PT Time Calculation (min) 38 min    Activity Tolerance Patient tolerated treatment well    Behavior During Therapy WFL for tasks assessed/performed          Past Medical History:  Diagnosis Date   Allergy    Claritin controls and Flonase    Asthma    as a CHILD- NO PROBELM AS ADULT   COVID-19    09/27/20   FH: melanoma    gets annual exams- Dr.Isenstein   GERD (gastroesophageal reflux disease)    Diet controlled   Migraines    Past Surgical History:  Procedure Laterality Date   CESAREAN SECTION     COLONOSCOPY WITH PROPOFOL  N/A 09/28/2021   Procedure: COLONOSCOPY WITH PROPOFOL ;  Surgeon: Unk Corinn Skiff, MD;  Location: [facility];  Service: Gastroenterology;  Laterality: N/A;   TONSILLECTOMY  1987   TUBAL LIGATION     Patient Active Problem List   Diagnosis Date Noted   Menstrual irregularity 02/16/2024   Snoring 02/16/2024   BPPV (benign paroxysmal positional vertigo), bilateral 07/19/2023   Colon cancer screening    Polyp of cecum    Family history of neoplasm of uncertain behavior of uterus 08/16/2021   Migraine 08/16/2021   Hypersomnia 06/19/2021   Irritable bowel syndrome with both constipation and diarrhea 06/19/2021   Decreased libido 06/19/2021   Chronic headaches 06/19/2021   Vertigo 06/19/2021   Cough 02/15/2021   B12 deficiency 02/15/2021   Encounter for general adult medical examination with abnormal findings 06/10/2020   FH: melanoma 06/10/2020   Allergic rhinitis 06/10/2020   Rectal spasm 06/10/2020   Immunizations incomplete 06/10/2020   History of 2019 novel  coronavirus disease (COVID-19) 06/10/2020    PCP: Glendia Shad, MD  REFERRING PROVIDER: Verta Royden DASEN, MD  REFERRING DIAG: M72.2 (ICD-10-CM) - Plantar fasciitis  THERAPY DIAG:  Pain in left ankle and joints of left foot  Muscle weakness (generalized)  Rationale for Evaluation and Treatment: Rehabilitation  ONSET DATE: June 2025  SUBJECTIVE:   SUBJECTIVE STATEMENT:  Pt reports that she just got back from the beach approximately an hour ago.  Pt notes that her pain is doing well, but her sciatica is bothering her, likely from the riding.  PERTINENT HISTORY: Pt reports gradual onset of L inferior heel pain where it gradual built up from June to September. Pt reports feels like she is walking on a pointed rock in the heel and achey in the medial arch. Pt does have pain the first few steps in the morning and with prolonged standing/walking. Pt's worst pain reported as a 6/10 NPS. Best pain reported 0/10 NPS. Pt reports current pain today 2/10 NPS. Pain improves with rest, new shoe wear and medicatoin. 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   PAIN:  Are you having pain? Yes: NPRS scale: 2/10 NPS Pain location: Inferior heel, medial arch posterior 1/3 Pain description: stepping on pointed rock, dull and achey Aggravating factors: standing/walking Relieving factors: rest,  PRECAUTIONS: None  RED FLAGS: None   WEIGHT BEARING RESTRICTIONS: No  FALLS:  Has patient fallen in last 6 months? No  LIVING ENVIRONMENT: Lives with: lives with their family   OCCUPATION: Astronomer for church  PLOF: Independent  PATIENT GOALS: Improve her pain  NEXT MD VISIT: N/A  OBJECTIVE:  Note: Objective measures were completed at Evaluation unless otherwise noted.  DIAGNOSTIC FINDINGS: Radiographs taken today demonstrate osseously mature individual some spurring is noted posterior and plantar heel.  No acute findings identified.  Soft  tissue increase in density at the plantar fascial calcaneal insertion site left heel.    PATIENT SURVEYS:  LEFS  Extreme difficulty/unable (0), Quite a bit of difficulty (1), Moderate difficulty (2), Little difficulty (3), No difficulty (4) Survey date:    Any of your usual work, housework or school activities 3  2. Usual hobbies, recreational or sporting activities 3  3. Getting into/out of the bath 4  4. Walking between rooms 3  5. Putting on socks/shoes 4  6. Squatting  3  7. Lifting an object, like a bag of groceries from the floor 4  8. Performing light activities around your home 3  9. Performing heavy activities around your home 3  10. Getting into/out of a car 4  11. Walking 2 blocks 2  12. Walking 1 mile 2  13. Going up/down 10 stairs (1 flight) 4  14. Standing for 1 hour 3  15.  sitting for 1 hour 3  16. Running on even ground 2  17. Running on uneven ground 2  18. Making sharp turns while running fast 3  19. Hopping  3  20. Rolling over in bed 4  Score total:  62/80     COGNITION: Overall cognitive status: Within functional limits for tasks assessed     SENSATION: WFL  EDEMA:  None present  POSTURE: No Significant postural limitations  PALPATION: TTP on heel pad on LLE and posterior third of medial arch  LOWER EXTREMITY ROM:  Active ROM Right eval Left eval  Hip flexion    Hip extension    Hip abduction    Hip adduction    Hip internal rotation    Hip external rotation    Knee flexion    Knee extension    Ankle dorsiflexion 2 8  Ankle plantarflexion 70 60  Ankle inversion 26 40  Ankle eversion 10 15  Great toe extension  55 35*   (Blank rows = not tested)  LOWER EXTREMITY MMT:  MMT Right eval Left eval  Hip flexion 5 5  Hip extension 5 4  Hip abduction 5 4  Hip adduction    Hip internal rotation 5 5  Hip external rotation 5 5  Knee flexion 5 5  Knee extension 5 5  Ankle dorsiflexion 5 5  Ankle plantarflexion 15 reps 15 reps   Ankle inversion 5 4  Ankle eversion 5 4   (Blank rows = not tested)  LOWER EXTREMITY SPECIAL TESTS:  Ankle special tests: Windlassdeferred to next session  JOINT MOBILITY: Talocrural: A/P hypomobile and painless  FUNCTIONAL TESTS:  10 meter walk test: deferred to next session  SLS:  LLE: ankle righting reaction. 10 sec.  RLE: normal. 10 sec.  GAIT: Distance walked: 10' Assistive device utilized: None Level of assistance: Complete Independence Comments: normal  TREATMENT DATE: 10/08/2024   Manual:   Supine STM with TP release technique applied to the plantar fascia for pain modulation and increased tissue extensibility  Supine fan mobilization of the metatarsals for improved mobility within the foot, 30 sec bouts each segment  Supine talocrural AP glide mobilizations, Grades II-III, 30 sec bouts x2 for increased ankle ROM  Supine STM with TP release technique applied to the anterior tibialis for pain modulation and increased tissue extensibility  Prone STM with TP release technique applied to the gastroc/soleus complex for pain modulation and increased tissue extensibility     PATIENT EDUCATION:  Education details: HEP, prognosis, POC Person educated: Patient Education method: Explanation and Handouts Education comprehension: verbalized understanding  HOME EXERCISE PROGRAM: Access Code: [email or web address] URL: [email or web address] Date: 09/22/2024 Prepared by: Sidra Simpers  Exercises - Seated Arch Lifts  - 1 x daily - 7 x weekly - 3 sets - 10 reps - 5 hold - Towel Scrunches  - 1 x daily - 7 x weekly - 3 sets - 10 reps - 5 hold - Ankle Inversion Eversion Towel Slide  - 1 x daily - 7 x weekly - 3 sets - 10 reps - 5 hold - Seated Great Toe Extension  - 1 x daily - 7 x weekly - 3 sets - 10 reps - Toe Spreading  - 1 x daily - 7 x  weekly - 3 sets - 10 reps - Seated Toe Curl  - 1 x daily - 7 x weekly - 3 sets - 10 reps - Seated Lesser Toes Extension  - 1 x daily - 7 x weekly - 3 sets - 10 reps - Standing Eccentric Heel Raise From Step With Towel Under Toes  - 1 x daily - 7 x weekly - 3 sets - 10 reps  ASSESSMENT:  CLINICAL IMPRESSION:  Patient session devoted more to manual therapy in order to reduce the amount of pain patient having.  Patient felt much better after the manual therapy approach at the last session, so it was continued during the session as well.  Patient noted to have several trigger points in the anterior tibialis as well as the gastrocsoleus complex.  Therapist was able to navigate those and release with good success rate.  Patient advised to continue with current HEP in order to improve overall strengthening of the ankle and toe musculature.   Pt will continue to benefit from skilled therapy to address remaining deficits in order to improve overall QoL and return to PLOF.    Note: Portions of this document were prepared using Dragon voice recognition software and although reviewed may contain unintentional dictation errors in syntax, grammar, or spelling.   OBJECTIVE IMPAIRMENTS: decreased balance, decreased mobility, decreased ROM, decreased strength, hypomobility, and pain.   ACTIVITY LIMITATIONS: standing and locomotion level  PARTICIPATION LIMITATIONS: community activity  PERSONAL FACTORS: Age, Past/current experiences, and Time since onset of injury/illness/exacerbation are also affecting patient's functional outcome.   REHAB POTENTIAL: Good  CLINICAL DECISION MAKING: Stable/uncomplicated  EVALUATION COMPLEXITY: Low   GOALS: Goals reviewed with patient? No  SHORT TERM GOALS: Target date: 09/28/24 Pt will be independent with HEP to improve L foot pain and LLE strength to improve standing and walking ADL's. Baseline:08/31/24: Provided initial HEP  09/07/24: Performing HEP independently   Goal status: ACHIEVED    LONG TERM GOALS: Target date: 10/23/24  Pt will improve LEFS by at least 9 points to demonstrate clinically significant improvement in function due to L foot pain.  Baseline: 08/31/24: 62/80 Goal status: ONGOING    2.  Pt will improve L great toe extension AROM to improve plantar fascia mobility to improve pain and optimize gait mechanics.  Baseline: 08/31/24: 35 degrees (normal is 70-80) Goal status: ONGOING   3.  Pt will report worst pain as 3/10 NPS or less with standing and walking to demonstrate clinically significant reduction in pain with ADL's. Baseline: 08/31/24: worst pain 6/10 NPS  Goal status: ONGOING      PLAN:  PT FREQUENCY: 1-2x/week  PT DURATION: 8 weeks  PLANNED INTERVENTIONS: 97164- PT Re-evaluation, 97750- Physical Performance Testing, 97110-Therapeutic exercises, 97530- Therapeutic activity, [id number]- Neuromuscular re-education, 97535- Self Care, 02859- Manual therapy, 97116- Gait training, [phone] (1-2 muscles), 20561 (3+ muscles)- Dry Needling, Patient/Family education, Balance training, Joint mobilization, Joint manipulation, Spinal manipulation, Spinal mobilization, Cryotherapy, and Moist heat  PLAN FOR NEXT SESSION:    L ankle eversion strength seated banded inversion. Doming for ankle intrinsics. Forward lunges to promote great toe mobility and hip strengthening start in static position and then attempt dynamic.    Fonda Simpers, PT, DPT Physical Therapist - [facility]  10/08/24, 4:55 PM    "

## 2024-10-13 ENCOUNTER — Ambulatory Visit

## 2024-10-13 DIAGNOSIS — M25572 Pain in left ankle and joints of left foot: Secondary | ICD-10-CM

## 2024-10-13 DIAGNOSIS — M6281 Muscle weakness (generalized): Secondary | ICD-10-CM

## 2024-10-13 NOTE — Therapy (Signed)
 " OUTPATIENT PHYSICAL THERAPY LOWER EXTREMITY TREATMENT   Patient Name: Kerry Garrett MRN: [id number] DOB:1971-03-01, 54 y.o., female Today's Date: 10/13/2024  END OF SESSION:  PT End of Session - 10/13/24 1603     Visit Number 6    Number of Visits 17    Date for Recertification  10/26/24    Progress Note Due on Visit 10    PT Start Time 1602    PT Stop Time 1645    PT Time Calculation (min) 43 min    Activity Tolerance Patient tolerated treatment well    Behavior During Therapy WFL for tasks assessed/performed           Past Medical History:  Diagnosis Date   Allergy    Claritin controls and Flonase    Asthma    as a CHILD- NO PROBELM AS ADULT   COVID-19    09/27/20   FH: melanoma    gets annual exams- Dr.Isenstein   GERD (gastroesophageal reflux disease)    Diet controlled   Migraines    Past Surgical History:  Procedure Laterality Date   CESAREAN SECTION     COLONOSCOPY WITH PROPOFOL  N/A 09/28/2021   Procedure: COLONOSCOPY WITH PROPOFOL ;  Surgeon: Unk Corinn Skiff, MD;  Location: [facility];  Service: Gastroenterology;  Laterality: N/A;   TONSILLECTOMY  1987   TUBAL LIGATION     Patient Active Problem List   Diagnosis Date Noted   Menstrual irregularity 02/16/2024   Snoring 02/16/2024   BPPV (benign paroxysmal positional vertigo), bilateral 07/19/2023   Colon cancer screening    Polyp of cecum    Family history of neoplasm of uncertain behavior of uterus 08/16/2021   Migraine 08/16/2021   Hypersomnia 06/19/2021   Irritable bowel syndrome with both constipation and diarrhea 06/19/2021   Decreased libido 06/19/2021   Chronic headaches 06/19/2021   Vertigo 06/19/2021   Cough 02/15/2021   B12 deficiency 02/15/2021   Encounter for general adult medical examination with abnormal findings 06/10/2020   FH: melanoma 06/10/2020   Allergic rhinitis 06/10/2020   Rectal spasm 06/10/2020   Immunizations incomplete 06/10/2020   History of 2019 novel  coronavirus disease (COVID-19) 06/10/2020    PCP: Glendia Shad, MD  REFERRING PROVIDER: Verta Royden DASEN, MD  REFERRING DIAG: M72.2 (ICD-10-CM) - Plantar fasciitis  THERAPY DIAG:  Pain in left ankle and joints of left foot  Muscle weakness (generalized)  Rationale for Evaluation and Treatment: Rehabilitation  ONSET DATE: June 2025  SUBJECTIVE:   SUBJECTIVE STATEMENT:  Pt reports that she is not having any pain upon arrival to the clinic.  Pt notes that the pain continues to be at the end of the day and notes it to be more intense in one centralized location within the plantar fascia.    PERTINENT HISTORY: Pt reports gradual onset of L inferior heel pain where it gradual built up from June to September. Pt reports feels like she is walking on a pointed rock in the heel and achey in the medial arch. Pt does have pain the first few steps in the morning and with prolonged standing/walking. Pt's worst pain reported as a 6/10 NPS. Best pain reported 0/10 NPS. Pt reports current pain today 2/10 NPS. Pain improves with rest, new shoe wear and medicatoin. 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   PAIN:  Are you having pain? Yes: NPRS scale: 2/10 NPS Pain location: Inferior heel, medial arch posterior 1/3 Pain  description: stepping on pointed rock, dull and achey Aggravating factors: standing/walking Relieving factors: rest,   PRECAUTIONS: None  RED FLAGS: None   WEIGHT BEARING RESTRICTIONS: No  FALLS:  Has patient fallen in last 6 months? No  LIVING ENVIRONMENT: Lives with: lives with their family   OCCUPATION: Astronomer for church  PLOF: Independent  PATIENT GOALS: Improve her pain  NEXT MD VISIT: N/A  OBJECTIVE:  Note: Objective measures were completed at Evaluation unless otherwise noted.  DIAGNOSTIC FINDINGS: Radiographs taken today demonstrate osseously mature individual some spurring is noted posterior and  plantar heel.  No acute findings identified.  Soft tissue increase in density at the plantar fascial calcaneal insertion site left heel.    PATIENT SURVEYS:  LEFS  Extreme difficulty/unable (0), Quite a bit of difficulty (1), Moderate difficulty (2), Little difficulty (3), No difficulty (4) Survey date:    Any of your usual work, housework or school activities 3  2. Usual hobbies, recreational or sporting activities 3  3. Getting into/out of the bath 4  4. Walking between rooms 3  5. Putting on socks/shoes 4  6. Squatting  3  7. Lifting an object, like a bag of groceries from the floor 4  8. Performing light activities around your home 3  9. Performing heavy activities around your home 3  10. Getting into/out of a car 4  11. Walking 2 blocks 2  12. Walking 1 mile 2  13. Going up/down 10 stairs (1 flight) 4  14. Standing for 1 hour 3  15.  sitting for 1 hour 3  16. Running on even ground 2  17. Running on uneven ground 2  18. Making sharp turns while running fast 3  19. Hopping  3  20. Rolling over in bed 4  Score total:  62/80     COGNITION: Overall cognitive status: Within functional limits for tasks assessed     SENSATION: WFL  EDEMA:  None present  POSTURE: No Significant postural limitations  PALPATION: TTP on heel pad on LLE and posterior third of medial arch  LOWER EXTREMITY ROM:  Active ROM Right eval Left eval  Hip flexion    Hip extension    Hip abduction    Hip adduction    Hip internal rotation    Hip external rotation    Knee flexion    Knee extension    Ankle dorsiflexion 2 8  Ankle plantarflexion 70 60  Ankle inversion 26 40  Ankle eversion 10 15  Great toe extension  55 35*   (Blank rows = not tested)  LOWER EXTREMITY MMT:  MMT Right eval Left eval  Hip flexion 5 5  Hip extension 5 4  Hip abduction 5 4  Hip adduction    Hip internal rotation 5 5  Hip external rotation 5 5  Knee flexion 5 5  Knee extension 5 5  Ankle  dorsiflexion 5 5  Ankle plantarflexion 15 reps 15 reps  Ankle inversion 5 4  Ankle eversion 5 4   (Blank rows = not tested)  LOWER EXTREMITY SPECIAL TESTS:  Ankle special tests: Windlassdeferred to next session  JOINT MOBILITY: Talocrural: A/P hypomobile and painless  FUNCTIONAL TESTS:  10 meter walk test: deferred to next session  SLS:  LLE: ankle righting reaction. 10 sec.  RLE: normal. 10 sec.  GAIT: Distance walked: 10' Assistive device utilized: None Level of assistance: Complete Independence Comments: normal  TREATMENT DATE: 10/13/2024   Manual:   Supine STM with TP release technique applied to the plantar fascia for pain modulation and increased tissue extensibility  Supine fan mobilization of the metatarsals for improved mobility within the foot, 30 sec bouts each segment  Supine talocrural AP glide mobilizations, Grades II-III, 30 sec bouts x2 for increased ankle ROM  Supine STM with TP release technique applied to the anterior tibialis for pain modulation and increased tissue extensibility  Supine STM with TP release technique applied to the gastroc/soleus complex for pain modulation and increased tissue extensibility      Neuro:   Seated arch lifts, 3 sec holds, 2x10 for increased proprioception of the arch and muscles that contribute to the proper foot dynamics  Seated towel scrunches, x5 total attempts grasping the length of longer towel  Seated toe yoga including great toe extension, lesser toes extension, and toe splaying 2x10 each activity  Seated marble/object pick up with use of toes, for great intrinsic muscle activation, 1 jar full of items  Standing eccentric lowering of the L LE to promote improved tissue extensibility of the plantar fascia, x10    PATIENT EDUCATION:  Education details: HEP, prognosis, POC Person  educated: Patient Education method: Explanation and Handouts Education comprehension: verbalized understanding  HOME EXERCISE PROGRAM: Access Code: [email or web address] URL: [email or web address] Date: 09/22/2024 Prepared by: Sidra Simpers  Exercises - Seated Arch Lifts  - 1 x daily - 7 x weekly - 3 sets - 10 reps - 5 hold - Towel Scrunches  - 1 x daily - 7 x weekly - 3 sets - 10 reps - 5 hold - Ankle Inversion Eversion Towel Slide  - 1 x daily - 7 x weekly - 3 sets - 10 reps - 5 hold - Seated Great Toe Extension  - 1 x daily - 7 x weekly - 3 sets - 10 reps - Toe Spreading  - 1 x daily - 7 x weekly - 3 sets - 10 reps - Seated Toe Curl  - 1 x daily - 7 x weekly - 3 sets - 10 reps - Seated Lesser Toes Extension  - 1 x daily - 7 x weekly - 3 sets - 10 reps - Standing Eccentric Heel Raise From Step With Towel Under Toes  - 1 x daily - 7 x weekly - 3 sets - 10 reps  ASSESSMENT:  CLINICAL IMPRESSION:  Patient continues to respond well to manual therapy, as well as newly introduced neuro based exercises.  Patient is making significant improvements with pain modulation noting increased pain only at night.  Patient and therapist discussed potential discharge in the near future due to the progress that patient has currently made.  Patient is able to perform exercises without any complication and is able to recruit intrinsic muscles better without an increase in pain as well.  Note: Portions of this document were prepared using Dragon voice recognition software and although reviewed may contain unintentional dictation errors in syntax, grammar, or spelling.   OBJECTIVE IMPAIRMENTS: decreased balance, decreased mobility, decreased ROM, decreased strength, hypomobility, and pain.   ACTIVITY LIMITATIONS: standing and locomotion level  PARTICIPATION LIMITATIONS: community activity  PERSONAL FACTORS: Age, Past/current experiences, and Time since onset of injury/illness/exacerbation are also  affecting patient's functional outcome.   REHAB POTENTIAL: Good  CLINICAL DECISION MAKING: Stable/uncomplicated  EVALUATION COMPLEXITY: Low   GOALS: Goals reviewed with patient? No  SHORT TERM GOALS: Target date: 09/28/24 Pt will be independent with HEP to improve L foot  pain and LLE strength to improve standing and walking ADL's. Baseline:08/31/24: Provided initial HEP  09/07/24: Performing HEP independently  Goal status: ACHIEVED    LONG TERM GOALS: Target date: 10/23/24  Pt will improve LEFS by at least 9 points to demonstrate clinically significant improvement in function due to L foot pain.  Baseline: 08/31/24: 62/80 Goal status: ONGOING    2.  Pt will improve L great toe extension AROM to improve plantar fascia mobility to improve pain and optimize gait mechanics.  Baseline: 08/31/24: 35 degrees (normal is 70-80) Goal status: ONGOING   3.  Pt will report worst pain as 3/10 NPS or less with standing and walking to demonstrate clinically significant reduction in pain with ADL's. Baseline: 08/31/24: worst pain 6/10 NPS  Goal status: ONGOING      PLAN:  PT FREQUENCY: 1-2x/week  PT DURATION: 8 weeks  PLANNED INTERVENTIONS: 97164- PT Re-evaluation, 97750- Physical Performance Testing, 97110-Therapeutic exercises, 97530- Therapeutic activity, [id number]- Neuromuscular re-education, 97535- Self Care, 02859- Manual therapy, 97116- Gait training, [phone] (1-2 muscles), 20561 (3+ muscles)- Dry Needling, Patient/Family education, Balance training, Joint mobilization, Joint manipulation, Spinal manipulation, Spinal mobilization, Cryotherapy, and Moist heat  PLAN FOR NEXT SESSION:   L ankle eversion strength seated banded inversion. Doming for ankle intrinsics. Forward lunges to promote great toe mobility and hip strengthening start in static position and then attempt dynamic.    Fonda Simpers, PT, DPT Physical Therapist - [facility]  10/13/24, 4:04  PM    "

## 2024-10-19 ENCOUNTER — Ambulatory Visit

## 2024-10-20 ENCOUNTER — Ambulatory Visit: Admitting: Internal Medicine

## 2024-10-20 ENCOUNTER — Encounter: Payer: Self-pay | Admitting: Internal Medicine

## 2024-10-20 NOTE — Progress Notes (Signed)
 Patient ID: Kerry Garrett, female   DOB: [birth year], 54 y.o.   MRN: [id number] Did not show for appt

## 2024-10-20 NOTE — Progress Notes (Deleted)
 "  Subjective:    Patient ID: Kerry Garrett, female    DOB: 02-Jan-1971, 54 y.o.   MRN: [id number]  Patient here for No chief complaint on file.   HPI Here for a scheduled follow up. Established care with me 02/11/24. SABRA Has had varying periods. Last - 12/17/23 - spotting x 5 days. (Prior to this 04/2023). Is followed by gyn. Seeing Dr Narvis. 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 Was started on current hormone therapy and supplements.   [facility] - records. Mammo?   Past Medical History:  Diagnosis Date   Allergy    Claritin controls and Flonase    Asthma    as a CHILD- NO PROBELM AS ADULT   COVID-19    09/27/20   FH: melanoma    gets annual exams- Dr.Isenstein   GERD (gastroesophageal reflux disease)    Diet controlled   Migraines    Past Surgical History:  Procedure Laterality Date   CESAREAN SECTION     COLONOSCOPY WITH PROPOFOL  N/A 09/28/2021   Procedure: COLONOSCOPY WITH PROPOFOL ;  Surgeon: Unk Corinn Skiff, MD;  Location: [facility] ENDOSCOPY;  Service: Gastroenterology;  Laterality: N/A;   TONSILLECTOMY  1987   TUBAL LIGATION     Family History  Problem Relation Age of Onset   Cancer Mother    Cancer Father    Asthma Son    Cancer Maternal Grandmother    Depression Maternal Grandmother    Cancer Maternal Grandfather    Social History   Socioeconomic History   Marital status: Married    Spouse name: Not on file   Number of children: 3   Years of education: Not on file   Highest education level: Master's degree (e.g., MA, MS, MEng, MEd, MSW, MBA)  Occupational History   Not on file  Tobacco Use   Smoking status: Never   Smokeless tobacco: Never  Vaping Use   Vaping status: Never Used  Substance and Sexual Activity   Alcohol use: Not Currently   Drug use: Never   Sexual activity: Yes    Partners: Male    Birth control/protection: Pill, Surgical  Other Topics Concern   Not on file  Social History Narrative   Married with  3 kids- ages  21-19-14   She enjoys the beach, swims, social   Social Drivers of Health   Tobacco Use: Low Risk (09/02/2024)   Patient History    Smoking Tobacco Use: Never    Smokeless Tobacco Use: Never    Passive Exposure: Not on file  Financial Resource Strain: Not on file  Food Insecurity: Not on file  Transportation Needs: Not on file  Physical Activity: Not on file  Stress: Not on file  Social Connections: Unknown (01/30/2022)   Received from [facility]   Social Network    Social Network: Not on file  Depression (PHQ2-9): Low Risk (07/19/2023)   Depression (PHQ2-9)    PHQ-2 Score: 2  Alcohol Screen: Not on file  Housing: Not on file  Utilities: Not on file  Health Literacy: Not on file     Review of Systems     Objective:     There were no vitals taken for this visit. Wt Readings from Last 3 Encounters:  02/11/24 160 lb (72.6 kg)  07/19/23 153 lb (69.4 kg)  09/28/21 145 lb (65.8 kg)    Physical Exam  {Perform Simple Foot Exam  Perform Detailed exam:1} {Insert foot Exam (Optional):30965}   Outpatient Encounter Medications as of  10/20/2024  Medication Sig   albuterol  (VENTOLIN  HFA) 108 (90 Base) MCG/ACT inhaler Inhale 1-2 puffs into the lungs every 6 (six) hours as needed for wheezing or shortness of breath.   b complex vitamins capsule Take 1 capsule by mouth daily.   benzonatate  (TESSALON ) 100 MG capsule Take 1 capsule (100 mg total) by mouth 3 (three) times daily as needed for cough.   celecoxib  (CELEBREX ) 200 MG capsule Take 1 capsule (200 mg total) by mouth 2 (two) times daily.   Cyanocobalamin  (VITAMIN B12) 1000 MCG TABS Take 1,000 mcg by mouth daily.   DHEA 10 MG CAPS Take 1 capsule by mouth daily.   doxycycline  (VIBRA -TABS) 100 MG tablet Take 1 tablet (100 mg total) by mouth 2 (two) times daily.   estradiol (VIVELLE-DOT) 0.025 MG/24HR Place 1 patch onto the skin 2 (two) times a week.   levocetirizine (XYZAL ALLERGY 24HR) 5 MG tablet Take 5 mg by mouth daily.    MAGNESIUM GLYCINATE PO Take by mouth.   melatonin 3 MG TABS tablet Take 3 mg by mouth at bedtime.   methylPREDNISolone  (MEDROL  DOSEPAK) 4 MG TBPK tablet 6 day dose pack - take as directed   predniSONE  (DELTASONE ) 20 MG tablet Take 2 tablets (40 mg total) by mouth daily with breakfast.   progesterone (PROMETRIUM) 100 MG capsule Take 100 mg by mouth at bedtime.   promethazine -dextromethorphan (PROMETHAZINE -DM) 6.25-15 MG/5ML syrup Take 5 mLs by mouth 4 (four) times daily as needed for cough.   Vitamin D -Vitamin K (D3 + K2 PO) Take 1 capsule by mouth daily.   No facility-administered encounter medications on file as of 10/20/2024.     Lab Results  Component Value Date   WBC 6.5 06/13/2020   HGB 13.8 06/13/2020   HCT 40.9 06/13/2020   PLT 210.0 06/13/2020   GLUCOSE 76 06/16/2021   CHOL 182 06/16/2021   TRIG 98.0 06/16/2021   HDL 54.40 06/16/2021   LDLCALC 108 (H) 06/16/2021   ALT 29 06/16/2021   AST 24 06/16/2021   NA 140 06/16/2021   K 4.4 06/16/2021   CL 104 06/16/2021   CREATININE 0.80 06/16/2021   BUN 19 06/16/2021   CO2 31 06/16/2021   TSH 3.11 12/25/2023   HGBA1C 5.6 06/16/2021    US  PELVIC COMPLETE WITH TRANSVAGINAL Result Date: 07/08/2024 CLINICAL DATA:  Abnormal uterine bleeding EXAM: TRANSABDOMINAL AND TRANSVAGINAL ULTRASOUND OF PELVIS TECHNIQUE: Both transabdominal and transvaginal ultrasound examinations of the pelvis were performed. Transabdominal technique was performed for global imaging of the pelvis including uterus, ovaries, adnexal regions, and pelvic cul-de-sac. It was necessary to proceed with endovaginal exam following the transabdominal exam to visualize the ovaries. COMPARISON:  None Available. FINDINGS: Uterus Measurements: 7.1 x 3.6 x 4.4 cm = volume: 59 mL. No fibroids or other mass visualized. Few punctate echogenicities may represent microcalcifications or micro cysts in submucosal along the internal os , nonspecific and can be seen in perimenopausal  patient. Endometrium Thickness: 10 mm.  No focal abnormality visualized. Right ovary Measurements: 1.4 x 1.2 x 1 cm = volume: 0.9 mL. Normal appearance/no adnexal mass. Left ovary Not visualized Other findings No abnormal free fluid. Prominent left adnexal vessels. IMPRESSION: Prominent adnexal vessels which can be seen the setting of pelvic congestion syndrome. Recommend correlation with clinical findings and pelvic MRI with contrast if clinically warranted. Nonvisualization of the left ovary which can be further assessed at the time of MRI. Electronically Signed   By: Megan  Zare M.D.   On: 07/08/2024  18:43   DG Foot Complete Left Result Date: 07/08/2024 Please see detailed radiograph report in office note.      Assessment & Plan:  There are no diagnoses linked to this encounter.   Allena Hamilton, MD "

## 2024-12-22 ENCOUNTER — Ambulatory Visit: Admitting: Internal Medicine

## 2024-12-29 ENCOUNTER — Ambulatory Visit: Admitting: Internal Medicine
